# Patient Record
Sex: Female | Born: 1985 | Race: Asian | Hispanic: No | Marital: Married | Smoking: Never smoker
Health system: Southern US, Community
[De-identification: ages and names within clinical notes are randomized; demographics above are authoritative.]

## PROBLEM LIST (undated history)

## (undated) ENCOUNTER — Inpatient Hospital Stay (HOSPITAL_COMMUNITY): Payer: Self-pay

---

## 2013-11-16 ENCOUNTER — Encounter (HOSPITAL_COMMUNITY): Payer: Self-pay | Admitting: *Deleted

## 2013-11-16 ENCOUNTER — Inpatient Hospital Stay (HOSPITAL_COMMUNITY)
Admission: AD | Admit: 2013-11-16 | Discharge: 2013-11-16 | Disposition: A | Discharge status: Home / Self Care | Payer: Medicaid Other | Source: Ambulatory Visit | Attending: Family Medicine | Admitting: Family Medicine

## 2013-11-16 DIAGNOSIS — O4703 False labor before 37 completed weeks of gestation, third trimester: Secondary | ICD-10-CM

## 2013-11-16 DIAGNOSIS — O479 False labor, unspecified: Secondary | ICD-10-CM

## 2013-11-16 LAB — URINALYSIS, ROUTINE W REFLEX MICROSCOPIC
Bilirubin Urine: NEGATIVE
Hgb urine dipstick: NEGATIVE
Ketones, ur: NEGATIVE mg/dL
Nitrite: NEGATIVE
Specific Gravity, Urine: 1.02 (ref 1.005–1.030)
Urobilinogen, UA: 0.2 mg/dL (ref 0.0–1.0)
pH: 7 (ref 5.0–8.0)

## 2013-11-16 LAB — URINE MICROSCOPIC-ADD ON

## 2013-11-16 NOTE — Progress Notes (Signed)
Dr. Corrin Parker and Clayton Lefort, CNM spoke with patient at length regarding current status, exam and plan of care. [employer] utilized.

## 2013-11-16 NOTE — MAU Provider Note (Signed)
History     CSN: [id number]  Arrival date and time: 11/16/13 1319   None     No chief complaint on file.  HPI Pt is a 27 yo G1 @ [redacted]w[redacted]d by LMP who presents to MAU looking for prenatal care after moving from [place] 3 weeks ago. She states that she began prenatal care in her country at 1.5 months and there have been no concerns about her pregnancy (she brought records with her that are in [place]). She has no complaints now. When asked she says she has had some pressure in her lower abdomen and R side that comes and goes but it is not painful or getting more frequent. This started last night. She denies vaginal bleeding, leaking of fluid or decrease in fetal movement. She has whitish discharge that she has had throughout the pregnancy. She denies dysuria, vaginal burning/itching, fever, n/v/d. She does have some constipation and usually has BM every 2 days. She would like be set up for continuing prenatal care.  History reviewed. No pertinent past medical history.  History reviewed. No pertinent past surgical history.  History reviewed. No pertinent family history.  History  Substance Use Topics  . Smoking status: Never Smoker   . Smokeless tobacco: Never Used  . Alcohol Use: No    Allergies: No Known Allergies  Prescriptions prior to admission  Medication Sig Dispense Refill  . Prenatal Vit-Fe Fumarate-FA (MULTIVITAMIN-PRENATAL) 27-0.8 MG TABS tablet Take 1 tablet by mouth daily at 12 noon.        Review of Systems  Constitutional: Negative for fever, chills and malaise/fatigue.  HENT: Negative for congestion and sore throat.   Eyes: Negative for blurred vision, double vision and photophobia.  Respiratory: Negative for cough, shortness of breath and wheezing.   Cardiovascular: Negative for chest pain, palpitations and leg swelling.  Gastrointestinal: Positive for constipation. Negative for nausea, vomiting, abdominal pain, diarrhea, blood in stool and melena.   Genitourinary: Negative for dysuria, hematuria and flank pain.  Musculoskeletal: Negative for back pain, falls and joint pain.  Skin: Negative for itching and rash.  Neurological: Negative for dizziness, tingling, tremors, focal weakness, seizures, weakness and headaches.   Physical Exam   Blood pressure 115/78, pulse 72, temperature 98.1 F (36.7 C), temperature source Oral, resp. rate 16, SpO2 100.00%.  Physical Exam  Nursing note and vitals reviewed. Constitutional: She is oriented to person, place, and time. She appears well-developed and well-nourished. No distress.  HENT:  Head: Normocephalic and atraumatic.  Eyes: Conjunctivae are normal. Pupils are equal, round, and reactive to light. No scleral icterus.  Neck: Normal range of motion.  Cardiovascular: Normal rate, regular rhythm and intact distal pulses.   No murmur heard. Respiratory: Effort normal and breath sounds normal. She has no wheezes.  GI: Soft. She exhibits no distension. There is no tenderness. There is no guarding.  Genitourinary: No vaginal discharge found.  Musculoskeletal: Normal range of motion. She exhibits no edema and no tenderness.  Neurological: She is alert and oriented to person, place, and time. She displays normal reflexes.  Skin: Skin is warm and dry. No rash noted. She is not diaphoretic. No erythema. No pallor.  Psychiatric: She has a normal mood and affect. Her behavior is normal.  Dilation: 2 Effacement (%): 60 Cervical Position: Anterior Station: -2 Presentation: Vertex Exam by:: L.Kirby, CNM  Toco: mild, irregular contractions vs irritability FHR: baseline 145 bpm, moderate variability, accels present, no decels  Results for orders placed during the hospital encounter  of 11/16/13 (from the past 24 hour(s))  URINALYSIS, ROUTINE W REFLEX MICROSCOPIC     Status: Abnormal   Collection Time    11/16/13  2:00 PM      Result Value Range   Color, Urine YELLOW  YELLOW   APPearance CLOUDY (*)  CLEAR   Specific Gravity, Urine 1.020  1.005 - 1.030   pH 7.0  5.0 - 8.0   Glucose, UA NEGATIVE  NEGATIVE mg/dL   Hgb urine dipstick NEGATIVE  NEGATIVE   Bilirubin Urine NEGATIVE  NEGATIVE   Ketones, ur NEGATIVE  NEGATIVE mg/dL   Protein, ur NEGATIVE  NEGATIVE mg/dL   Urobilinogen, UA 0.2  0.0 - 1.0 mg/dL   Nitrite NEGATIVE  NEGATIVE   Leukocytes, UA SMALL (*) NEGATIVE  URINE MICROSCOPIC-ADD ON     Status: Abnormal   Collection Time    11/16/13  2:00 PM      Result Value Range   Squamous Epithelial / LPF MANY (*) RARE   WBC, UA 11-20  <3 WBC/hpf   Bacteria, UA FEW (*) RARE    MAU Course  Procedures  MDM Mild, irregular contractions on tocometer with cervical dilation on exam in G1 still preterm. She is not feeling painful contractions or having bleeding or leaking of fluid. Urine not concerning for infection though does have WBC as well as squamous epithelial cells. Urine culture obtained. Will orally rehydrate and continue to monitor. Will reassess for cervical change in 1 hour. On repeat cervical exam, no change was noted. Contractions remained irregular and non-painful.  Assessment and Plan  27 yo G1 @ [redacted]w[redacted]d with false labor and need for [facility] Sent message to [facility] to set up appointment to continue prenatal care Preterm labor precautions given  Pior, Jearld Lesch 11/16/2013, 4:55 PM   I have seen this patient and agree with the above resident's note.  Consulted with Dr Shawnie Pons at time of MAU visit.  Pt [redacted]w[redacted]d by 11 week sono according to prenatal records with no report of contractions and none on toco. Some of her prenatal records are in [place], with normal labs/ultrasounds reviewed.  Category I FHR tracing today.    LEFTWICH-KIRBY, LISA Certified Nurse-Midwife

## 2013-11-16 NOTE — MAU Note (Signed)
Patient presents to MAU having recently moved here from [facility] and states wants to make sure everything is okay with her pregnancy. States she did not understand what her doctors were telling her. Denies LOF, VB, nor contractions at this time. REports white discharge without odor. REports good fetal movement.

## 2013-11-18 LAB — URINE CULTURE

## 2013-11-19 ENCOUNTER — Telehealth: Payer: Self-pay | Admitting: Medical

## 2013-11-19 ENCOUNTER — Telehealth: Payer: Self-pay | Admitting: *Deleted

## 2013-11-19 DIAGNOSIS — B952 Enterococcus as the cause of diseases classified elsewhere: Secondary | ICD-10-CM

## 2013-11-19 MED ORDER — CEPHALEXIN 500 MG PO CAPS: 500.0000 mg | ORAL_CAPSULE | Freq: Four times a day (QID) | ORAL | Status: DC

## 2013-11-19 NOTE — Telephone Encounter (Signed)
No answer and no VM at patient's primary number. LM with Service for [employer] listed as emergency contact to return call to MAU because we were trying to get in touch with a patient. Rx for Keflex sent to patient's pharmacy for UTI  Freddi Starr, PA-C 11/19/2013 9:57 AM

## 2013-11-19 NOTE — Telephone Encounter (Signed)
Message copied by Gerome Apley on Mon Nov 19, 2013  2:24 PM ------      Message from: Reva Bores      Created: Mon Nov 19, 2013  8:10 AM       Has UTI--needs keflex 500 mg po qid # 28, no RF ------

## 2013-11-19 NOTE — Telephone Encounter (Signed)
Called Ray and unable to leave a message - heard message voicemail not set up.

## 2013-11-20 NOTE — MAU Provider Note (Signed)
Chart reviewed and agree with management and plan.  

## 2013-11-21 ENCOUNTER — Encounter: Payer: Self-pay | Admitting: Family Medicine

## 2013-11-21 NOTE — Telephone Encounter (Signed)
Patient has new ob appt tomorrow. Note made to inform her at visit. Tried calling patient but heard message that voicemail is not set up.

## 2013-12-13 NOTE — L&D Delivery Note (Signed)
Attestation of Attending Supervision of Advanced Practitioner (PA/CNM/NP): Evaluation and management procedures were performed by the Advanced Practitioner under my supervision and collaboration.  I have reviewed the Advanced Practitioner's note and chart, and I agree with the management and plan.  Diquan Kassis, MD, FACOG Attending Obstetrician & Gynecologist Faculty Practice, [facility]  

## 2013-12-13 NOTE — L&D Delivery Note (Signed)
Delivery Note At 5:41 PM a viable female was delivered via Vaginal, Spontaneous Delivery (Presentation: Left Occiput Anterior).  APGAR: 8, 9; weight 5 lb 13 oz (2637 g).   Placenta status: Intact, Spontaneous.  Cord: 3 vessels with the following complications: None.   Anesthesia: Local  Episiotomy: None Lacerations: Sulcal, Left labial and partial 3rd degree Suture Repair: 3.0 monocryl on CT Est. Blood Loss (mL): 350  Mom to postpartum.  Baby to Couplet care / Skin to Skin.  NSVD with complicated tear. Active management of 3rd stage of labor with pit and traction delivered intact placenta w/ 3v cord. Repaired tear with reinforced rectal sheath w/ 3.0 monocyl on CT. Repaired sulcal tear and labial tear in usual fashion. Hemostatic at completion. EBL 350. Counts correct.  Tina Byrd, Tina Byrd 12/28/2013, 6:21 PM

## 2013-12-28 ENCOUNTER — Encounter (HOSPITAL_COMMUNITY): Payer: Self-pay | Admitting: *Deleted

## 2013-12-28 ENCOUNTER — Inpatient Hospital Stay (HOSPITAL_COMMUNITY)
Admission: AD | Admit: 2013-12-28 | Discharge: 2013-12-30 | DRG: 775 | Disposition: A | Discharge status: Home / Self Care | Payer: Medicaid Other | Source: Ambulatory Visit | Attending: Obstetrics & Gynecology | Admitting: Obstetrics & Gynecology

## 2013-12-28 DIAGNOSIS — IMO0001 Reserved for inherently not codable concepts without codable children: Secondary | ICD-10-CM

## 2013-12-28 DIAGNOSIS — O0930 Supervision of pregnancy with insufficient antenatal care, unspecified trimester: Secondary | ICD-10-CM

## 2013-12-28 LAB — URINALYSIS, ROUTINE W REFLEX MICROSCOPIC
BILIRUBIN URINE: NEGATIVE
Glucose, UA: NEGATIVE mg/dL
Ketones, ur: NEGATIVE mg/dL
Nitrite: NEGATIVE
PH: 6.5 (ref 5.0–8.0)
Protein, ur: NEGATIVE mg/dL
SPECIFIC GRAVITY, URINE: 1.015 (ref 1.005–1.030)
UROBILINOGEN UA: 0.2 mg/dL (ref 0.0–1.0)

## 2013-12-28 LAB — COMPREHENSIVE METABOLIC PANEL
ALT: 10 U/L (ref 0–35)
AST: 16 U/L (ref 0–37)
Albumin: 2.9 g/dL — ABNORMAL LOW (ref 3.5–5.2)
Alkaline Phosphatase: 208 U/L — ABNORMAL HIGH (ref 39–117)
BUN: 7 mg/dL (ref 6–23)
CALCIUM: 9.4 mg/dL (ref 8.4–10.5)
CO2: 24 meq/L (ref 19–32)
CREATININE: 0.56 mg/dL (ref 0.50–1.10)
Chloride: 100 mEq/L (ref 96–112)
GFR calc non Af Amer: >90 mL/min (ref >90)
GLUCOSE: 73 mg/dL (ref 70–99)
Potassium: 4.6 mEq/L (ref 3.7–5.3)
SODIUM: 136 meq/L — AB (ref 137–147)
Total Bilirubin: <0.2 mg/dL — ABNORMAL LOW (ref 0.3–1.2)
Total Protein: 6.8 g/dL (ref 6.0–8.3)

## 2013-12-28 LAB — OB RESULTS CONSOLE GBS: STREP GROUP B AG: NEGATIVE

## 2013-12-28 LAB — URINE MICROSCOPIC-ADD ON

## 2013-12-28 LAB — CBC
HCT: 39.1 % (ref 36.0–46.0)
Hemoglobin: 13.7 g/dL (ref 12.0–15.0)
MCH: 30.5 pg (ref 26.0–34.0)
MCHC: 35 g/dL (ref 30.0–36.0)
MCV: 87.1 fL (ref 78.0–100.0)
Platelets: 302 10*3/uL (ref 150–400)
RBC: 4.49 MIL/uL (ref 3.87–5.11)
RDW: 14.6 % (ref 11.5–15.5)
WBC: 13.9 10*3/uL — ABNORMAL HIGH (ref 4.0–10.5)

## 2013-12-28 LAB — DIFFERENTIAL
BASOS ABS: 0 10*3/uL (ref 0.0–0.1)
BASOS PCT: 0 % (ref 0–1)
Eosinophils Absolute: 0.1 10*3/uL (ref 0.0–0.7)
Eosinophils Relative: 0 % (ref 0–5)
Lymphocytes Relative: 12 % (ref 12–46)
Lymphs Abs: 1.7 10*3/uL (ref 0.7–4.0)
Monocytes Absolute: 1.1 10*3/uL — ABNORMAL HIGH (ref 0.1–1.0)
Monocytes Relative: 8 % (ref 3–12)
NEUTROS ABS: 11.1 10*3/uL — AB (ref 1.7–7.7)
NEUTROS PCT: 80 % — AB (ref 43–77)

## 2013-12-28 LAB — SICKLE CELL SCREEN: Sickle Cell Screen: NEGATIVE

## 2013-12-28 LAB — RPR: RPR: NONREACTIVE

## 2013-12-28 LAB — ABO/RH: ABO/RH(D): O POS

## 2013-12-28 LAB — TYPE AND SCREEN
ABO/RH(D): O POS
ANTIBODY SCREEN: NEGATIVE

## 2013-12-28 LAB — GROUP B STREP BY PCR: Group B strep by PCR: NEGATIVE

## 2013-12-28 LAB — OB RESULTS CONSOLE HIV ANTIBODY (ROUTINE TESTING): HIV: NONREACTIVE

## 2013-12-28 LAB — RAPID HIV SCREEN (WH-MAU): Rapid HIV Screen: NONREACTIVE

## 2013-12-28 LAB — HEPATITIS B SURFACE ANTIGEN: Hepatitis B Surface Ag: NEGATIVE

## 2013-12-28 MED ORDER — DIBUCAINE 1 % RE OINT: 1.0000 "application " | TOPICAL_OINTMENT | RECTAL | Status: DC | PRN

## 2013-12-28 MED ORDER — OXYTOCIN 40 UNITS IN LACTATED RINGERS INFUSION - SIMPLE MED
62.5000 mL/h | INTRAVENOUS | Status: DC
  Administered 2013-12-28: 62.5 mL/h via INTRAVENOUS

## 2013-12-28 MED ORDER — CITRIC ACID-SODIUM CITRATE 334-500 MG/5ML PO SOLN: 30.0000 mL | ORAL | Status: DC | PRN

## 2013-12-28 MED ORDER — WITCH HAZEL-GLYCERIN EX PADS
1.0000 "application " | MEDICATED_PAD | CUTANEOUS | Status: DC | PRN
  Administered 2013-12-29: 1 via TOPICAL

## 2013-12-28 MED ORDER — TETANUS-DIPHTH-ACELL PERTUSSIS 5-2.5-18.5 LF-MCG/0.5 IM SUSP: 0.5000 mL | Freq: Once | INTRAMUSCULAR | Status: DC

## 2013-12-28 MED ORDER — DIPHENHYDRAMINE HCL 25 MG PO CAPS: 25.0000 mg | ORAL_CAPSULE | Freq: Four times a day (QID) | ORAL | Status: DC | PRN

## 2013-12-28 MED ORDER — SIMETHICONE 80 MG PO CHEW: 80.0000 mg | CHEWABLE_TABLET | ORAL | Status: DC | PRN

## 2013-12-28 MED ORDER — ZOLPIDEM TARTRATE 5 MG PO TABS
5.0000 mg | ORAL_TABLET | Freq: Every evening | ORAL | Status: DC | PRN
Start: 2013-12-28 — End: 2013-12-30

## 2013-12-28 MED ORDER — OXYCODONE-ACETAMINOPHEN 5-325 MG PO TABS
1.0000 | ORAL_TABLET | ORAL | Status: DC | PRN
  Administered 2013-12-29: 1 via ORAL
  Filled 2013-12-28: qty 1

## 2013-12-28 MED ORDER — LANOLIN HYDROUS EX OINT: TOPICAL_OINTMENT | CUTANEOUS | Status: DC | PRN

## 2013-12-28 MED ORDER — ONDANSETRON HCL 4 MG/2ML IJ SOLN: 4.0000 mg | Freq: Four times a day (QID) | INTRAMUSCULAR | Status: DC | PRN

## 2013-12-28 MED ORDER — FLEET ENEMA 7-19 GM/118ML RE ENEM: 1.0000 | ENEMA | RECTAL | Status: DC | PRN

## 2013-12-28 MED ORDER — ACETAMINOPHEN 325 MG PO TABS: 650.0000 mg | ORAL_TABLET | ORAL | Status: DC | PRN

## 2013-12-28 MED ORDER — ONDANSETRON HCL 4 MG/2ML IJ SOLN: 4.0000 mg | INTRAMUSCULAR | Status: DC | PRN

## 2013-12-28 MED ORDER — IBUPROFEN 600 MG PO TABS
600.0000 mg | ORAL_TABLET | Freq: Four times a day (QID) | ORAL | Status: DC | PRN
  Administered 2013-12-28: 600 mg via ORAL
  Filled 2013-12-28: qty 1

## 2013-12-28 MED ORDER — PRENATAL MULTIVITAMIN CH
1.0000 | ORAL_TABLET | Freq: Every day | ORAL | Status: DC
  Administered 2013-12-29 – 2013-12-30 (×2): 1 via ORAL
  Filled 2013-12-28 (×2): qty 1

## 2013-12-28 MED ORDER — IBUPROFEN 600 MG PO TABS
600.0000 mg | ORAL_TABLET | Freq: Four times a day (QID) | ORAL | Status: DC
  Administered 2013-12-28 – 2013-12-30 (×7): 600 mg via ORAL
  Filled 2013-12-28 (×7): qty 1

## 2013-12-28 MED ORDER — OXYTOCIN 40 UNITS IN LACTATED RINGERS INFUSION - SIMPLE MED
1.0000 m[IU]/min | INTRAVENOUS | Status: DC
  Administered 2013-12-28: 2 m[IU]/min via INTRAVENOUS
  Filled 2013-12-28: qty 1000

## 2013-12-28 MED ORDER — LIDOCAINE HCL (PF) 1 % IJ SOLN
30.0000 mL | INTRAMUSCULAR | Status: DC | PRN
  Administered 2013-12-28: 30 mL via SUBCUTANEOUS
  Filled 2013-12-28 (×2): qty 30

## 2013-12-28 MED ORDER — OXYCODONE-ACETAMINOPHEN 5-325 MG PO TABS
1.0000 | ORAL_TABLET | ORAL | Status: DC | PRN
  Administered 2013-12-28: 1 via ORAL
  Filled 2013-12-28: qty 1

## 2013-12-28 MED ORDER — FENTANYL CITRATE 0.05 MG/ML IJ SOLN
100.0000 ug | INTRAMUSCULAR | Status: DC | PRN
  Administered 2013-12-28: 100 ug via INTRAVENOUS
  Filled 2013-12-28: qty 2

## 2013-12-28 MED ORDER — OXYTOCIN BOLUS FROM INFUSION
500.0000 mL | INTRAVENOUS | Status: DC
Start: 2013-12-28 — End: 2013-12-28

## 2013-12-28 MED ORDER — LACTATED RINGERS IV SOLN: 500.0000 mL | INTRAVENOUS | Status: DC | PRN

## 2013-12-28 MED ORDER — LACTATED RINGERS IV SOLN
INTRAVENOUS | Status: DC
  Administered 2013-12-28: 125 mL/h via INTRAVENOUS

## 2013-12-28 MED ORDER — ONDANSETRON HCL 4 MG PO TABS: 4.0000 mg | ORAL_TABLET | ORAL | Status: DC | PRN

## 2013-12-28 MED ORDER — TERBUTALINE SULFATE 1 MG/ML IJ SOLN: 0.2500 mg | Freq: Once | INTRAMUSCULAR | Status: DC | PRN

## 2013-12-28 MED ORDER — BENZOCAINE-MENTHOL 20-0.5 % EX AERO
1.0000 "application " | INHALATION_SPRAY | CUTANEOUS | Status: DC | PRN
  Administered 2013-12-29: 1 via TOPICAL
  Filled 2013-12-28: qty 56

## 2013-12-28 MED ORDER — SENNOSIDES-DOCUSATE SODIUM 8.6-50 MG PO TABS
2.0000 | ORAL_TABLET | ORAL | Status: DC
  Administered 2013-12-28 – 2013-12-30 (×2): 2 via ORAL
  Filled 2013-12-28 (×2): qty 2

## 2013-12-28 NOTE — MAU Note (Signed)
C/o ucs since 0400 this AM; 

## 2013-12-28 NOTE — H&P (Signed)
Attestation of Attending Supervision of Obstetric Fellow: Evaluation and management procedures were performed by the Obstetric Fellow under my supervision and collaboration.  I have reviewed the Obstetric Fellow's note and chart, and I agree with the management and plan.  Yalanda Soderman, MD, FACOG Attending Obstetrician & Gynecologist Faculty Practice, [facility]   

## 2013-12-28 NOTE — H&P (Signed)
CSN: [id number]  Arrival date and time: 12/28/13 [id number]  None  Chief Complaint   Patient presents with   .  Labor Eval    HPI  [name] is a 28 y.o. female G1P0 at 6427w6d who presents for labor evaluation. Pt says her "pains started at 0400". Pt has no prenatal care; was seen here in MAU about a month ago and referred to the low risk clinic. Pt did not schedule a clinic appointment because " I had no medicaid". Pt has had no US done with this pregnancy.  OB History    Grav  Para  Term  Preterm  Abortions  TAB  SAB  Ect  Mult  Living    1               History reviewed. No pertinent past medical history.  History reviewed. No pertinent past surgical history.  Family History   Problem  Relation  Age of Onset   .  Diabetes  Mother     History   Substance Use Topics   .  Smoking status:  Never Smoker   .  Smokeless tobacco:  Never Used   .  Alcohol Use:  No    Allergies: No Known Allergies  Prescriptions prior to admission   Medication  Sig  Dispense  Refill   .  Prenatal Vit-Fe Fumarate-FA (PRENATAL MULTIVITAMIN) TABS tablet  Take 1 tablet by mouth daily at 12 noon.      Results for orders placed during the hospital encounter of 12/28/13 (from the past 48 hour(s))   URINALYSIS, ROUTINE W REFLEX MICROSCOPIC Status: Abnormal    Collection Time    12/28/13 9:35 AM   Result  Value  Range    Color, Urine  YELLOW  YELLOW    APPearance  CLOUDY (*)  CLEAR    Specific Gravity, Urine  1.015  1.005 - 1.030    pH  6.5  5.0 - 8.0    Glucose, UA  NEGATIVE  NEGATIVE mg/dL    Hgb urine dipstick  TRACE (*)  NEGATIVE    Bilirubin Urine  NEGATIVE  NEGATIVE    Ketones, ur  NEGATIVE  NEGATIVE mg/dL    Protein, ur  NEGATIVE  NEGATIVE mg/dL    Urobilinogen, UA  0.2  0.0 - 1.0 mg/dL    Nitrite  NEGATIVE  NEGATIVE    Leukocytes, UA  TRACE (*)  NEGATIVE   URINE MICROSCOPIC-ADD ON Status: Abnormal    Collection Time    12/28/13 9:35 AM   Result  Value  Range    Squamous Epithelial / LPF  MANY  (*)  RARE    WBC, UA  0-2  <3 WBC/hpf    Bacteria, UA  RARE  RARE   CBC Status: Abnormal    Collection Time    12/28/13 10:52 AM   Result  Value  Range    WBC  13.9 (*)  4.0 - 10.5 K/uL    RBC  4.49  3.87 - 5.11 MIL/uL    Hemoglobin  13.7  12.0 - 15.0 g/dL    HCT  11.939.1  14.736.0 - 82.946.0 %    MCV  87.1  78.0 - 100.0 fL    MCH  30.5  26.0 - 34.0 pg    MCHC  35.0  30.0 - 36.0 g/dL    RDW  56.214.6  13.011.5 - 86.515.5 %    Platelets  302  150 - 400 K/uL  DIFFERENTIAL Status: Abnormal    Collection Time    12/28/13 10:52 AM   Result  Value  Range    Neutrophils Relative %  80 (*)  43 - 77 %    Neutro Abs  11.1 (*)  1.7 - 7.7 K/uL    Lymphocytes Relative  12  12 - 46 %    Lymphs Abs  1.7  0.7 - 4.0 K/uL    Monocytes Relative  8  3 - 12 %    Monocytes Absolute  1.1 (*)  0.1 - 1.0 K/uL    Eosinophils Relative  0  0 - 5 %    Eosinophils Absolute  0.1  0.0 - 0.7 K/uL    Basophils Relative  0  0 - 1 %    Basophils Absolute  0.0  0.0 - 0.1 K/uL    Dilation: 3.5  Effacement (%): 90  Cervical Position: Middle  Station: -2  Presentation: Vertex  Exam by:: Morrison Old RN  Review of Systems  Constitutional: Negative for fever and chills.  Gastrointestinal: Positive for abdominal pain. Negative for nausea, vomiting, diarrhea and constipation.  Genitourinary: Negative for dysuria, urgency, frequency and hematuria.   Prenatal Transfer Tool  Maternal Diabetes: Unknown. No PNC. Genetic Screening: None. No PNC. Maternal Ultrasounds/Referrals: None. No PNC Fetal Ultrasounds or other Referrals:  None. No PNC. Maternal Substance Abuse:  Unknown. No medical record Significant Maternal Medications:  None. No PNC. Significant Maternal Lab Results:  None. No PNC. Other Comments:  N/a   Physical Exam   Blood pressure 125/81, pulse 79, temperature 98.3 F (36.8 C), temperature source Oral, resp. rate 18.  Physical Exam  Constitutional: She is oriented to person, place, and time. She appears well-developed  and well-nourished. No distress.  HENT:  Head: Normocephalic.  Eyes: Pupils are equal, round, and reactive to light.  Neck: Neck supple.  GI: Soft.  Musculoskeletal: Normal range of motion.  Neurological: She is alert and oriented to person, place, and time.  Skin: Skin is warm. She is not diaphoretic.  Psychiatric: Her behavior is normal.   Fetal Tracing:  Baseline: 135 bpm  Variability: Moderate  Accelerations: 15x15  Decelerations: None  Toco: Contraction 4-6 mins apart  MAU Course   Procedures  None  MDM  NST  OB panel  GBS stat  Dr. Ike Bene notified of patient status; will admit to labor and delivery.  Assessment and Plan   A:  Labor; term gestation  No prenatal care   Tina Byrd is a 28 y.o. G1P0 at [redacted]w[redacted]d  here for SOOL #Labor: Pt with no recent chagne, will augment with pit #Pain: Desires IV pain meds and epidural #FWB: Cat i #ID:  Gbs Neg #MOF: Breast #MOC:Declines currently  Admit to Labor and delivery  Iona Hansen Rasch, NP  12/28/2013, 11:30 AM   Tawana Scale, MD OB Fellow

## 2013-12-28 NOTE — MAU Provider Note (Signed)
Attestation of Attending Supervision of Advanced Practitioner (PA/CNM/NP): Evaluation and management procedures were performed by the Advanced Practitioner under my supervision and collaboration.  I have reviewed the Advanced Practitioner's note and chart, and I agree with the management and plan.  Kyilee Gregg, MD, FACOG Attending Obstetrician & Gynecologist Faculty Practice, [facility]  

## 2013-12-28 NOTE — Progress Notes (Signed)
Family from [place].  Pt and family wanted infant wrapped and held by father.  Unable to explain importance of skin to skin due to language barrier

## 2013-12-28 NOTE — H&P (Signed)
Attestation of Attending Supervision of Obstetric Fellow: Evaluation and management procedures were performed by the Obstetric Fellow under my supervision and collaboration.  I have reviewed the Obstetric Fellow's note and chart, and I agree with the management and plan.  Lekesha Claw, MD, FACOG Attending Obstetrician & Gynecologist Faculty Practice, [facility]   

## 2013-12-28 NOTE — H&P (Signed)
DUPLICATE dUPLICATE   Maternal Medical History:  Reason for admission: Contractions.  Nausea.  Contractions: Onset was 6-12 hours ago.   Frequency: regular.   Duration is approximately 3 minutes.   Perceived severity is moderate.    Fetal activity: Perceived fetal activity is normal.    Prenatal complications: No PNC     Review of Systems  Constitutional: Negative for fever and chills.  Gastrointestinal: Negative for nausea, vomiting and diarrhea.  Genitourinary: Negative for dysuria and urgency.    Maternal Exam:  Uterine Assessment: Contraction strength is moderate.  Contraction duration is 3 minutes. Contraction frequency is regular.   Abdomen: Patient reports no abdominal tenderness. Estimated fetal weight is 6 lb 8 oz.   Fetal presentation: vertex  Introitus: Normal vulva. Normal vagina.  Amniotic fluid character: not assessed.  Cervix: Cervix evaluated by digital exam.     Physical Exam  Constitutional: She appears well-developed and well-nourished.  HENT:  Head: Normocephalic.  Cardiovascular: Normal rate, regular rhythm and normal heart sounds.   GI: There is no guarding.  Musculoskeletal: She exhibits no edema.  Neurological: She is alert.

## 2013-12-28 NOTE — MAU Provider Note (Signed)
History     CSN: [id number]  Arrival date and time: 12/28/13 4098   None     Chief Complaint  Patient presents with  . Labor Eval   HPI  [name] is a 28 y.o. female G1P0 at [redacted]w[redacted]d who presents for labor evaluation. Pt says her "pains started at 0400". Pt has no prenatal care; was seen here in MAU about a month ago and referred to the low risk clinic. Pt did not schedule a clinic appointment because " I had no medicaid". Pt has had no US done with this pregnancy.   OB History   Grav Para Term Preterm Abortions TAB SAB Ect Mult Living   1               History reviewed. No pertinent past medical history.  History reviewed. No pertinent past surgical history.  Family History  Problem Relation Age of Onset  . Diabetes Mother     History  Substance Use Topics  . Smoking status: Never Smoker   . Smokeless tobacco: Never Used  . Alcohol Use: No    Allergies: No Known Allergies  Prescriptions prior to admission  Medication Sig Dispense Refill  . Prenatal Vit-Fe Fumarate-FA (PRENATAL MULTIVITAMIN) TABS tablet Take 1 tablet by mouth daily at 12 noon.       Results for orders placed during the hospital encounter of 12/28/13 (from the past 48 hour(s))  URINALYSIS, ROUTINE W REFLEX MICROSCOPIC     Status: Abnormal   Collection Time    12/28/13  9:35 AM      Result Value Range   Color, Urine YELLOW  YELLOW   APPearance CLOUDY (*) CLEAR   Specific Gravity, Urine 1.015  1.005 - 1.030   pH 6.5  5.0 - 8.0   Glucose, UA NEGATIVE  NEGATIVE mg/dL   Hgb urine dipstick TRACE (*) NEGATIVE   Bilirubin Urine NEGATIVE  NEGATIVE   Ketones, ur NEGATIVE  NEGATIVE mg/dL   Protein, ur NEGATIVE  NEGATIVE mg/dL   Urobilinogen, UA 0.2  0.0 - 1.0 mg/dL   Nitrite NEGATIVE  NEGATIVE   Leukocytes, UA TRACE (*) NEGATIVE  URINE MICROSCOPIC-ADD ON     Status: Abnormal   Collection Time    12/28/13  9:35 AM      Result Value Range   Squamous Epithelial / LPF MANY (*) RARE   WBC, UA  0-2  <3 WBC/hpf   Bacteria, UA RARE  RARE  CBC     Status: Abnormal   Collection Time    12/28/13 10:52 AM      Result Value Range   WBC 13.9 (*) 4.0 - 10.5 K/uL   RBC 4.49  3.87 - 5.11 MIL/uL   Hemoglobin 13.7  12.0 - 15.0 g/dL   HCT 11.9  14.7 - 82.9 %   MCV 87.1  78.0 - 100.0 fL   MCH 30.5  26.0 - 34.0 pg   MCHC 35.0  30.0 - 36.0 g/dL   RDW 56.2  13.0 - 86.5 %   Platelets 302  150 - 400 K/uL  DIFFERENTIAL     Status: Abnormal   Collection Time    12/28/13 10:52 AM      Result Value Range   Neutrophils Relative % 80 (*) 43 - 77 %   Neutro Abs 11.1 (*) 1.7 - 7.7 K/uL   Lymphocytes Relative 12  12 - 46 %   Lymphs Abs 1.7  0.7 - 4.0 K/uL  Monocytes Relative 8  3 - 12 %   Monocytes Absolute 1.1 (*) 0.1 - 1.0 K/uL   Eosinophils Relative 0  0 - 5 %   Eosinophils Absolute 0.1  0.0 - 0.7 K/uL   Basophils Relative 0  0 - 1 %   Basophils Absolute 0.0  0.0 - 0.1 K/uL    Dilation: 3.5 Effacement (%): 90 Cervical Position: Middle Station: -2 Presentation: Vertex Exam by:: Morrison Oldee Carter RN  Review of Systems  Constitutional: Negative for fever and chills.  Gastrointestinal: Positive for abdominal pain. Negative for nausea, vomiting, diarrhea and constipation.  Genitourinary: Negative for dysuria, urgency, frequency and hematuria.   Physical Exam   Blood pressure 125/81, pulse 79, temperature 98.3 F (36.8 C), temperature source Oral, resp. rate 18.  Physical Exam  Constitutional: She is oriented to person, place, and time. She appears well-developed and well-nourished. No distress.  HENT:  Head: Normocephalic.  Eyes: Pupils are equal, round, and reactive to light.  Neck: Neck supple.  GI: Soft.  Musculoskeletal: Normal range of motion.  Neurological: She is alert and oriented to person, place, and time.  Skin: Skin is warm. She is not diaphoretic.  Psychiatric: Her behavior is normal.    Fetal Tracing: Baseline: 135 bpm  Variability: Moderate  Accelerations:  15x15 Decelerations: None Toco: Contraction 4-6 mins apart    MAU Course  Procedures None  MDM NST OB panel GBS stat Dr. Ike Benedom notified of patient status; will admit to labor and delivery.   Assessment and Plan   A: Labor; term gestation  No prenatal care   P: Admit to Labor and delivery   Tina HansenJennifer Irene Schawn Byas, NP  12/28/2013, 11:30 AM

## 2013-12-28 NOTE — Progress Notes (Signed)
Dr Ike Benedom talking with pt concerning pain medication options and [facility]

## 2013-12-28 NOTE — Progress Notes (Signed)
involentary urge to push

## 2013-12-29 LAB — GC/CHLAMYDIA PROBE AMP
CT Probe RNA: NEGATIVE
GC Probe RNA: NEGATIVE

## 2013-12-29 LAB — RUBELLA SCREEN: RUBELLA: 1.79 {index} — AB (ref <0.90)

## 2013-12-29 NOTE — Discharge Summary (Signed)
Obstetric Discharge Summary Reason for Admission: onset of labor Prenatal Procedures: none Intrapartum Procedures: spontaneous vaginal delivery Postpartum Procedures: none Complications-Operative and Postpartum: 3rd  degree perineal laceration and sulcal laceration Hemoglobin  Date Value Range Status  12/28/2013 13.7  12.0 - 15.0 g/dL Final     HCT  Date Value Range Status  12/28/2013 39.1  36.0 - 46.0 % Final    Physical Exam:  General: alert, cooperative and no distress Lochia: appropriate Uterine Fundus: firm Incision: healing well, no significant erythema DVT Evaluation: No evidence of DVT seen on physical exam.  Discharge Diagnoses: Term Pregnancy-delivered  Discharge Information: Date: 12/29/2013 Activity: unrestricted Diet: routine Medications: PNV, Ibuprofen, Colace and tucks Condition: stable Instructions: refer to practice specific booklet Discharge to: home Follow-up Information   [facility]. Call in 1 week. (If you do not receive call in one week to schedule appt., call them to schedule 6 week appt.)    Specialty:  Obstetrics and Gynecology   Contact information:   [address] [place] [id number] [phone]      Newborn Data: Live born female  Birth Weight: 5 lb 13 oz (2637 g) APGAR: 8, 9  Home with mother.  Tina Byrd, Tina 12/29/2013, 8:16 AM  I have seen and examined this patient and I agree with the above. Cam HaiSHAW, Miranda Garber 8:38 AM 12/29/2013

## 2013-12-29 NOTE — Progress Notes (Signed)
Clinical Social Work Department PSYCHOSOCIAL ASSESSMENT - MATERNAL/CHILD 12/29/2013  Patient:  Tina Byrd, Tina Byrd  Account Number:  [id number]  Admit Date:  12/28/2013  Ardine Eng Name:   Tina Byrd [facility]    Clinical Social Worker:  Shavon Ashmore, LCSW   Date/Time:  12/29/2013 11:00 AM  Date Referred:  12/29/2013   Referral source  Central Nursery     Referred reason  [facility]   Other referral source:    I:  FAMILY / HOME ENVIRONMENT Child's legal guardian:  PARENT  Guardian - Name Guardian - Age Guardian - Address  [address] [place], [place] [address]  Elvera Maria  same as above   Other household support members/support persons Other support:    II  PSYCHOSOCIAL DATA Information Source:  Patient Interview  Occupational hygienist Employment:   Museum/gallery curator resources:  Self Pay If [place]:    School / Grade:   Maternity Care Coordinator / Child Services Coordination / Early Interventions:  Cultural issues impacting care:    III  STRENGTHS Strengths  Home prepared for Child (including basic supplies)  Supportive family/friends   Strength comment:    IV  RISK FACTORS AND CURRENT PROBLEMS Current Problem:     No barriers to discharge noted at this time.  V  SOCIAL WORK ASSESSMENT Acknowledged order for social work consult due to [facility]. Met with both parents.  They are of [place] descent.  Mother states that they moved to the [place] about two months and are currently being supported through a program that provides assistance to refugees.  Informed that they have a case manager Nsoma Mick Sell [phone] who is assisting them with connecting with available resources. Also informed that they have an aunt, Hrin Guadelupe Sabin Par who has been living here for 8 years and has been supportive. They reportedly have needed baby care items.  Father is not currently employed.  Informed that he is waiting for his ID to arrive then he will be able to  seek employment.  She denies any hx of substance abuse or mental illness.  Mother states that she received [place] in [place].  Informed parents of the hospital's drug screen policy.  UDS on newborn was negative.  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      VI SOCIAL WORK PLAN  Type of pt/family education:   If child protective services report - county:   If child protective services report - date:   Information/referral to community resources comment:   Other social work plan:   Will continue to monitor drug screen

## 2013-12-30 MED ORDER — PRENATAL MULTIVITAMIN CH: 1.0000 | ORAL_TABLET | Freq: Every day | ORAL | Status: DC

## 2013-12-30 MED ORDER — IBUPROFEN 600 MG PO TABS: 600.0000 mg | ORAL_TABLET | Freq: Four times a day (QID) | ORAL | Status: DC | PRN

## 2013-12-30 NOTE — Discharge Summary (Signed)
  Obstetric Discharge Summary Reason for Admission: onset of labor Prenatal Procedures: NST Intrapartum Procedures: spontaneous vaginal delivery Postpartum Procedures: none Complications-Operative and Postpartum: Sulcal, Left labial and partial 3rd degree perineal tear  Hemoglobin  Date Value Range Status  12/28/2013 13.7  12.0 - 15.0 g/dL Final     HCT  Date Value Range Status  12/28/2013 39.1  36.0 - 46.0 % Final    Physical Exam:  General: alert, cooperative and no distress Lochia: appropriate Uterine Fundus: firm DVT Evaluation: No evidence of DVT seen on physical exam.  Discharge Diagnoses: Term Pregnancy-delivered  Discharge Information: Date: 12/30/2013 Activity: pelvic rest Diet: routine Medications: PNV, Ibuprofen and Colace Condition: stable Instructions: refer to practice specific booklet Discharge to: home Follow-up Information   [facility] Call in 1 week. (If you do not receive call in one week to schedule appt., call them to schedule 6 week appt.)    Specialty:  Obstetrics and Gynecology   Contact information:   [address] [place] [id number] [phone]      Newborn Data: Live born female  Birth Weight: 5 lb 13 oz (2637 g) APGAR: 8, 9  Home with mother.  Tina Byrd 12/30/2013, 8:07 AM

## 2013-12-30 NOTE — Discharge Summary (Addendum)
I spoke with and examined patient and agree with resident's note and plan of care.  Eating, drinking, voiding, ambulating well.  +flatus.  Lochia and pain wnl.  Denies dizziness, lightheadedness, or sob. No complaints.  No pnc, to f/u at clinic. Note sent to clinic to contact pt to make pp visit for 4-6 weeks. Pt to call clinic if she hasn't heard from them.  Had partial 3rd degree, recommended colace and increased po fluid intake prophylactically  Cheral MarkerKimberly R. Mckyla Deckman, CNM, [facility]NP-BC 12/30/2013 9:12 AM

## 2013-12-30 NOTE — Discharge Summary (Signed)
Attestation of Attending Supervision of Advanced Practitioner (PA/CNM/NP): Evaluation and management procedures were performed by the Advanced Practitioner under my supervision and collaboration.  I have reviewed the Advanced Practitioner's note and chart, and I agree with the management and plan.  Hurman Ketelsen, MD, FACOG Attending Obstetrician & Gynecologist Faculty Practice, [facility]  

## 2013-12-30 NOTE — Discharge Instructions (Signed)
You may want to take a stool softener such as Colace daily or twice daily to help prevent constipation. Drink plenty of water to stay well hydrated.   Before [facility] Ask any questions about feeding, diapering, and baby care before you leave the hospital. Ask again if you do not understand. Ask when you need to see the doctor again. There are several things you must have before your baby comes home.  Infant car seat.  Crib.  Do not let your baby sleep in a bed with you or anyone else.  If you do not have a bed for your baby, ask the doctor what you can use that will be safe for the baby to sleep in. Infant feeding supplies:  6 to 8 bottles (8 oz. size).  6 to 8 nipples.  Measuring cup.  Measuring tablespoon.  Bottle brush.  Sterilizer (or use any large pan or kettle with a lid).  Formula that contains iron.  A way to boil and cool water. Breastfeeding supplies:  Breast pump.  Nipple cream. Clothing:  24 to 36 cloth diapers and waterproof diaper covers or a box of disposable diapers. You may need as many as 10 to 12 diapers per day.  3 onesies (other clothing will depend on the time of year and the weather).  3 receiving blankets.  3 baby pajamas or gowns.  3 bibs. Bath equipment:  Mild soap.  Petroleum jelly. No baby oil or powder.  Soft cloth towel and wash cloth.  Cotton balls.  Separate bath basin for baby. Only sponge bathe until umbilical cord and circumcision are healed. Other supplies:  Thermometer and bulb syringe (ask the hospital to send them home with you). Ask your doctor about how you should take your baby's temperature.  One to two pacifiers. Prepare for an emergency:  Know how to get to the hospital and know where to admit your baby.  Put all doctor numbers near your house phone and in your cell phone if you have one. Prepare your family:  Talk with siblings about the baby coming home and how they feel about it.  Decide how  you want to handle visitors and other family members.  Take offers for help with the baby. You will need time to adjust. Know when to call the doctor.  GET HELP RIGHT AWAY IF:  Your baby's temperature is greater than 100.4 F (38 C).  The softspot on your baby's head starts to bulge.  Your baby is crying with no tears or has no wet diapers for 6 hours.  Your baby has rapid breathing.  Your baby is not as alert. Document Released: 11/11/2008 Document Revised: 02/21/2012 Document Reviewed: 02/18/2011 [facility] Patient Information 2014 [place], [place].  Vaginal Delivery Care After Refer to this sheet in the next few weeks. These discharge instructions provide you with information on caring for yourself after delivery. Your caregiver may also give you specific instructions. Your treatment has been planned according to the most current medical practices available, but problems sometimes occur. Call your caregiver if you have any problems or questions after you go home. HOME CARE INSTRUCTIONS  Take over-the-counter or prescription medicines only as directed by your caregiver or pharmacist.  Do not drink alcohol, especially if you are breastfeeding or taking medicine to relieve pain.  Do not chew or smoke tobacco.  Do not use illegal drugs.  Continue to use good perineal care. Good perineal care includes:  Wiping your perineum from front to back.  Keeping your perineum clean.  Do not use tampons or douche until your caregiver says it is okay.  Shower, wash your hair, and take tub baths as directed by your caregiver.  Wear a well-fitting bra that provides breast support.  Eat healthy foods.  Drink enough fluids to keep your urine clear or pale yellow.  Eat high-fiber foods such as whole grain cereals and breads, brown rice, beans, and fresh fruits and vegetables every day. These foods may help prevent or relieve constipation.  Follow your cargiver's recommendations regarding  resumption of activities such as climbing stairs, driving, lifting, exercising, or traveling.  Talk to your caregiver about resuming sexual activities. Resumption of sexual activities is dependent upon your risk of infection, your rate of healing, and your comfort and desire to resume sexual activity.  Try to have someone help you with your household activities and your newborn for at least a few days after you leave the hospital.  Rest as much as possible. Try to rest or take a nap when your newborn is sleeping.  Increase your activities gradually.  Keep all of your scheduled postpartum appointments. It is very important to keep your scheduled follow-up appointments. At these appointments, your caregiver will be checking to make sure that you are healing physically and emotionally. SEEK MEDICAL CARE IF:   You are passing large clots from your vagina. Save any clots to show your caregiver.  You have a foul smelling discharge from your vagina.  You have trouble urinating.  You are urinating frequently.  You have pain when you urinate.  You have a change in your bowel movements.  You have increasing redness, pain, or swelling near your vaginal incision (episiotomy) or vaginal tear.  You have pus draining from your episiotomy or vaginal tear.  Your episiotomy or vaginal tear is separating.  You have painful, hard, or reddened breasts.  You have a severe headache.  You have blurred vision or see spots.  You feel sad or depressed.  You have thoughts of hurting yourself or your newborn.  You have questions about your care, the care of your newborn, or medicines.  You are dizzy or lightheaded.  You have a rash.  You have nausea or vomiting.  You were breastfeeding and have not had a menstrual period within 12 weeks after you stopped breastfeeding.  You are not breastfeeding and have not had a menstrual period by the 12th week after delivery.  You have a fever. SEEK  IMMEDIATE MEDICAL CARE IF:   You have persistent pain.  You have chest pain.  You have shortness of breath.  You faint.  You have leg pain.  You have stomach pain.  Your vaginal bleeding saturates two or more sanitary pads in 1 hour. MAKE SURE YOU:   Understand these instructions.  Will watch your condition.  Will get help right away if you are not doing well or get worse. Document Released: 11/26/2000 Document Revised: 08/23/2012 Document Reviewed: 07/26/2012 [facility] Patient Information 2014 [place], [place].  Breastfeeding Deciding to breastfeed is one of the best choices you can make for you and your baby. A change in hormones during pregnancy causes your breast tissue to grow and increases the number and size of your milk ducts. These hormones also allow proteins, sugars, and fats from your blood supply to make breast milk in your milk-producing glands. Hormones prevent breast milk from being released before your baby is born as well as prompt milk flow after birth. Once breastfeeding has  begun, thoughts of your baby, as well as his or her sucking or crying, can stimulate the release of milk from your milk-producing glands.  BENEFITS OF BREASTFEEDING For Your Baby  Your first milk (colostrum) helps your baby's digestive system function better.   There are antibodies in your milk that help your baby fight off infections.   Your baby has a lower incidence of asthma, allergies, and sudden infant death syndrome.   The nutrients in breast milk are better for your baby than infant formulas and are designed uniquely for your baby's needs.   Breast milk improves your baby's brain development.   Your baby is less likely to develop other conditions, such as childhood obesity, asthma, or type 2 diabetes mellitus.  For You   Breastfeeding helps to create a very special bond between you and your baby.   Breastfeeding is convenient. Breast milk is always available at the  correct temperature and costs nothing.   Breastfeeding helps to burn calories and helps you lose the weight gained during pregnancy.   Breastfeeding makes your uterus contract to its prepregnancy size faster and slows bleeding (lochia) after you give birth.   Breastfeeding helps to lower your risk of developing type 2 diabetes mellitus, osteoporosis, and breast or ovarian cancer later in life. SIGNS THAT YOUR BABY IS HUNGRY Early Signs of Hunger  Increased alertness or activity.  Stretching.  Movement of the head from side to side.  Movement of the head and opening of the mouth when the corner of the mouth or cheek is stroked (rooting).  Increased sucking sounds, smacking lips, cooing, sighing, or squeaking.  Hand-to-mouth movements.  Increased sucking of fingers or hands. Late Signs of Hunger  Fussing.  Intermittent crying. Extreme Signs of Hunger Signs of extreme hunger will require calming and consoling before your baby will be able to breastfeed successfully. Do not wait for the following signs of extreme hunger to occur before you initiate breastfeeding:   Restlessness.  A loud, strong cry.   Screaming. BREASTFEEDING BASICS Breastfeeding Initiation  Find a comfortable place to sit or lie down, with your neck and back well supported.  Place a pillow or rolled up blanket under your baby to bring him or her to the level of your breast (if you are seated). Nursing pillows are specially designed to help support your arms and your baby while you breastfeed.  Make sure that your baby's abdomen is facing your abdomen.   Gently massage your breast. With your fingertips, massage from your chest wall toward your nipple in a circular motion. This encourages milk flow. You may need to continue this action during the feeding if your milk flows slowly.  Support your breast with 4 fingers underneath and your thumb above your nipple. Make sure your fingers are well away from  your nipple and your baby's mouth.   Stroke your baby's lips gently with your finger or nipple.   When your baby's mouth is open wide enough, quickly bring your baby to your breast, placing your entire nipple and as much of the colored area around your nipple (areola) as possible into your baby's mouth.   More areola should be visible above your baby's upper lip than below the lower lip.   Your baby's tongue should be between his or her lower gum and your breast.   Ensure that your baby's mouth is correctly positioned around your nipple (latched). Your baby's lips should create a seal on your breast and be turned  out (everted).  It is common for your baby to suck about 2 3 minutes in order to start the flow of breast milk. Latching Teaching your baby how to latch on to your breast properly is very important. An improper latch can cause nipple pain and decreased milk supply for you and poor weight gain in your baby. Also, if your baby is not latched onto your nipple properly, he or she may swallow some air during feeding. This can make your baby fussy. Burping your baby when you switch breasts during the feeding can help to get rid of the air. However, teaching your baby to latch on properly is still the best way to prevent fussiness from swallowing air while breastfeeding. Signs that your baby has successfully latched on to your nipple:    Silent tugging or silent sucking, without causing you pain.   Swallowing heard between every 3 4 sucks.    Muscle movement above and in front of his or her ears while sucking.  Signs that your baby has not successfully latched on to nipple:   Sucking sounds or smacking sounds from your baby while breastfeeding.  Nipple pain. If you think your baby has not latched on correctly, slip your finger into the corner of your baby's mouth to break the suction and place it between your baby's gums. Attempt breastfeeding initiation again. Signs of  Successful Breastfeeding Signs from your baby:   A gradual decrease in the number of sucks or complete cessation of sucking.   Falling asleep.   Relaxation of his or her body.   Retention of a small amount of milk in his or her mouth.   Letting go of your breast by himself or herself. Signs from you:  Breasts that have increased in firmness, weight, and size 1 3 hours after feeding.   Breasts that are softer immediately after breastfeeding.  Increased milk volume, as well as a change in milk consistency and color by the 5th day of breastfeeding.   Nipples that are not sore, cracked, or bleeding. Signs That Your Pecola Leisure is Getting Enough Milk  Wetting at least 3 diapers in a 24-hour period. The urine should be clear and pale yellow by age 9 days.  At least 3 stools in a 24-hour period by age 9 days. The stool should be soft and yellow.  At least 3 stools in a 24-hour period by age 491 days. The stool should be seedy and yellow.  No loss of weight greater than 10% of birth weight during the first 32 days of age.  Average weight gain of 4 7 ounces (120 210 mL) per week after age 492 days.  Consistent daily weight gain by age 9 days, without weight loss after the age of 2 weeks. After a feeding, your baby may spit up a small amount. This is common. BREASTFEEDING FREQUENCY AND DURATION Frequent feeding will help you make more milk and can prevent sore nipples and breast engorgement. Breastfeed when you feel the need to reduce the fullness of your breasts or when your baby shows signs of hunger. This is called "breastfeeding on demand." Avoid introducing a pacifier to your baby while you are working to establish breastfeeding (the first 4 6 weeks after your baby is born). After this time you may choose to use a pacifier. Research has shown that pacifier use during the first year of a baby's life decreases the risk of sudden infant death syndrome (SIDS). Allow your baby to feed on each  breast as long as he or she wants. Breastfeed until your baby is finished feeding. When your baby unlatches or falls asleep while feeding from the first breast, offer the second breast. Because newborns are often sleepy in the first few weeks of life, you may need to awaken your baby to get him or her to feed. Breastfeeding times will vary from baby to baby. However, the following rules can serve as a guide to help you ensure that your baby is properly fed:  Newborns (babies 43 weeks of age or younger) may breastfeed every 1 3 hours.  Newborns should not go longer than 3 hours during the day or 5 hours during the night without breastfeeding.  You should breastfeed your baby a minimum of 8 times in a 24-hour period until you begin to introduce solid foods to your baby at around 12 months of age. BREAST MILK PUMPING Pumping and storing breast milk allows you to ensure that your baby is exclusively fed your breast milk, even at times when you are unable to breastfeed. This is especially important if you are going back to work while you are still breastfeeding or when you are not able to be present during feedings. Your lactation consultant can give you guidelines on how long it is safe to store breast milk.  A breast pump is a machine that allows you to pump milk from your breast into a sterile bottle. The pumped breast milk can then be stored in a refrigerator or freezer. Some breast pumps are operated by hand, while others use electricity. Ask your lactation consultant which type will work best for you. Breast pumps can be purchased, but some hospitals and breastfeeding support groups lease breast pumps on a monthly basis. A lactation consultant can teach you how to hand express breast milk, if you prefer not to use a pump.  CARING FOR YOUR BREASTS WHILE YOU BREASTFEED Nipples can become dry, cracked, and sore while breastfeeding. The following recommendations can help keep your breasts moisturized and  healthy:  Avoid using soap on your nipples.   Wear a supportive bra. Although not required, special nursing bras and tank tops are designed to allow access to your breasts for breastfeeding without taking off your entire bra or top. Avoid wearing underwire style bras or extremely tight bras.  Air dry your nipples for 3 after each feeding.   Use only cotton bra pads to absorb leaked breast milk. Leaking of breast milk between feedings is normal.   Use lanolin on your nipples after breastfeeding. Lanolin helps to maintain your skin's normal moisture barrier. If you use pure lanolin you do not need to wash it off before feeding your baby again. Pure lanolin is not toxic to your baby. You may also hand express a few drops of breast milk and gently massage that milk into your nipples and allow the milk to air dry. In the first few weeks after giving birth, some women experience extremely full breasts (engorgement). Engorgement can make your breasts feel heavy, warm, and tender to the touch. Engorgement peaks within 3 5 days after you give birth. The following recommendations can help ease engorgement:  Completely empty your breasts while breastfeeding or pumping. You may want to start by applying warm, moist heat (in the shower or with warm water-soaked hand towels) just before feeding or pumping. This increases circulation and helps the milk flow. If your baby does not completely empty your breasts while breastfeeding, pump any extra milk after  he or she is finished.  Wear a snug bra (nursing or regular) or tank top for 1 2 days to signal your body to slightly decrease milk production.  Apply ice packs to your breasts, unless this is too uncomfortable for you.  Make sure that your baby is latched on and positioned properly while breastfeeding. If engorgement persists after 48 hours of following these recommendations, contact your health care provider or a Advertising copywriter. OVERALL  HEALTH CARE RECOMMENDATIONS WHILE BREASTFEEDING  Eat healthy foods. Alternate between meals and snacks, eating 3 of each per day. Because what you eat affects your breast milk, some of the foods may make your baby more irritable than usual. Avoid eating these foods if you are sure that they are negatively affecting your baby.  Drink milk, fruit juice, and water to satisfy your thirst (about 10 glasses a day).   Rest often, relax, and continue to take your prenatal vitamins to prevent fatigue, stress, and anemia.  Continue breast self-awareness checks.  Avoid chewing and smoking tobacco.  Avoid alcohol and drug use. Some medicines that may be harmful to your baby can pass through breast milk. It is important to ask your health care provider before taking any medicine, including all over-the-counter and prescription medicine as well as vitamin and herbal supplements. It is possible to become pregnant while breastfeeding. If birth control is desired, ask your health care provider about options that will be safe for your baby. SEEK MEDICAL CARE IF:   You feel like you want to stop breastfeeding or have become frustrated with breastfeeding.  You have painful breasts or nipples.  Your nipples are cracked or bleeding.  Your breasts are red, tender, or warm.  You have a swollen area on either breast.  You have a fever or chills.  You have nausea or vomiting.  You have drainage other than breast milk from your nipples.  Your breasts do not become full before feedings by the 5th day after you give birth.  You feel sad and depressed.  Your baby is too sleepy to eat well.  Your baby is having trouble sleeping.   Your baby is wetting less than 3 diapers in a 24-hour period.  Your baby has less than 3 stools in a 24-hour period.  Your baby's skin or the white part of his or her eyes becomes yellow.   Your baby is not gaining weight by 42 days of age. SEEK IMMEDIATE MEDICAL CARE  IF:   Your baby is overly tired (lethargic) and does not want to wake up and feed.  Your baby develops an unexplained fever. Document Released: 11/29/2005 Document Revised: 08/01/2013 Document Reviewed: 05/23/2013 [facility] Patient Information 2014 [place], [place].

## 2013-12-31 NOTE — Progress Notes (Signed)
Post discharge chart review completed.  

## 2014-01-10 ENCOUNTER — Encounter: Payer: Self-pay | Admitting: Nurse Practitioner

## 2014-01-10 ENCOUNTER — Encounter: Payer: Self-pay | Admitting: Dentistry

## 2014-01-24 ENCOUNTER — Ambulatory Visit (INDEPENDENT_AMBULATORY_CARE_PROVIDER_SITE_OTHER): Payer: Medicaid Other | Admitting: Nurse Practitioner

## 2014-01-24 ENCOUNTER — Encounter: Payer: Self-pay | Admitting: Nurse Practitioner

## 2014-01-24 VITALS — BP 122/80 | HR 72 | Temp 98.5°F | Ht 60.0 in | Wt 120.2 lb

## 2014-01-24 DIAGNOSIS — Z309 Encounter for contraceptive management, unspecified: Secondary | ICD-10-CM | POA: Insufficient documentation

## 2014-01-24 DIAGNOSIS — Z609 Problem related to social environment, unspecified: Secondary | ICD-10-CM

## 2014-01-24 DIAGNOSIS — Z789 Other specified health status: Secondary | ICD-10-CM | POA: Insufficient documentation

## 2014-01-24 DIAGNOSIS — N898 Other specified noninflammatory disorders of vagina: Secondary | ICD-10-CM

## 2014-01-24 NOTE — Progress Notes (Addendum)
Patient ID: Tina Byrd, female   DOB: 05/26/1986, 28 y.o.   MRN: [id number] History:  Tina Byrd is a 28 y.o. G1P1001 who [facility] today for postpartum care and contraception. She delivered a baby girl four weeks ago. She has an interpreter with her today that speaks Bur[place]ese. She has NSVD with a 3rd degree tear. She has not started her menses and has not resumed intercourse. She would like to start the BCP but does not have Medicaid and can not afford the pill. She is breast and bottle feeding. She and the baby are doing well.   The following portions of the patient's history were reviewed and updated as appropriate: allergies, current medications, past family history, past medical history, past social history, past surgical history and problem list.  Review of Systems:  Pertinent items are noted in HPI.  Objective:  Physical Exam BP 122/80  Pulse 72  Temp(Src) 98.5 F (36.9 C) (Oral)  Ht 5' (1.524 m)  Wt 120 lb 3.2 oz (54.522 kg)  BMI 23.47 kg/m2  Breastfeeding? Yes GENERAL: Well-developed, well-nourished female in no acute distress. Burmese, non English speaking HEENT: Normocephalic, atraumatic.  NECK: Supple. Normal thyroid.  LUNGS: Normal rate. Clear to auscultation bilaterally.  HEART: Regular rate and rhythm with no adventitious sounds.  BREASTS: Symmetric in size. No masses, skin changes, nipple drainage, or lymphadenopathy. ABDOMEN: Soft, nontender, nondistended. No organomegaly. Normal bowel sounds appreciated in all quadrants.  PELVIC: Normal external genitalia that is still healing, including a visible stitch. Vagina is pink and rugated. Discharge is creamy with odor. Normal cervix contour.  Uterus is normal in size. No adnexal mass or tenderness.  EXTREMITIES: No cyanosis, clubbing, or edema, 2+ distal pulses.   Labs and Imaging No results found.    Assessment & Plan:  Assessment:  Postpartum Care Contraception Vaginal  discharge  Plans:  Will use condoms until Medicaid is in place When Medicaid is in place she can be called in [facility] BCP one po qday # 1 pack with 11 refills Will call her if wet prep is abnormal  Delbert PhenixLinda M Angelica Wix, NP 01/24/2014 3:09 PM

## 2014-01-24 NOTE — Patient Instructions (Signed)
Contraception Choices Contraception (birth control) is the use of any methods or devices to prevent pregnancy. Below are some methods to help avoid pregnancy. HORMONAL METHODS   Contraceptive implant This is a thin, plastic tube containing progesterone hormone. It does not contain estrogen hormone. Your health care provider inserts the tube in the inner part of the upper arm. The tube can remain in place for up to 3 years. After 3 years, the implant must be removed. The implant prevents the ovaries from releasing an egg (ovulation), thickens the cervical mucus to prevent sperm from entering the uterus, and thins the lining of the inside of the uterus.  Progesterone-only injections These injections are given every 3 months by your health care provider to prevent pregnancy. This synthetic progesterone hormone stops the ovaries from releasing eggs. It also thickens cervical mucus and changes the uterine lining. This makes it harder for sperm to survive in the uterus.  Birth control pills These pills contain estrogen and progesterone hormone. They work by preventing the ovaries from releasing eggs (ovulation). They also cause the cervical mucus to thicken, preventing the sperm from entering the uterus. Birth control pills are prescribed by a health care provider.Birth control pills can also be used to treat heavy periods.  Minipill This type of birth control pill contains only the progesterone hormone. They are taken every day of each month and must be prescribed by your health care provider.  Birth control patch The patch contains hormones similar to those in birth control pills. It must be changed once a week and is prescribed by a health care provider.  Vaginal ring The ring contains hormones similar to those in birth control pills. It is left in the vagina for 3 weeks, removed for 1 week, and then a new one is put back in place. The patient must be comfortable inserting and removing the ring from the  vagina.A health care provider's prescription is necessary.  Emergency contraception Emergency contraceptives prevent pregnancy after unprotected sexual intercourse. This pill can be taken right after sex or up to 5 days after unprotected sex. It is most effective the sooner you take the pills after having sexual intercourse. Most emergency contraceptive pills are available without a prescription. Check with your pharmacist. Do not use emergency contraception as your only form of birth control. BARRIER METHODS   Female condom This is a thin sheath (latex or rubber) that is worn over the penis during sexual intercourse. It can be used with spermicide to increase effectiveness.  Female condom. This is a soft, loose-fitting sheath that is put into the vagina before sexual intercourse.  Diaphragm This is a soft, latex, dome-shaped barrier that must be fitted by a health care provider. It is inserted into the vagina, along with a spermicidal jelly. It is inserted before intercourse. The diaphragm should be left in the vagina for 6 to 8 hours after intercourse.  Cervical cap This is a round, soft, latex or plastic cup that fits over the cervix and must be fitted by a health care provider. The cap can be left in place for up to 48 hours after intercourse.  Sponge This is a soft, circular piece of polyurethane foam. The sponge has spermicide in it. It is inserted into the vagina after wetting it and before sexual intercourse.  Spermicides These are chemicals that kill or block sperm from entering the cervix and uterus. They come in the form of creams, jellies, suppositories, foam, or tablets. They do not require a   prescription. They are inserted into the vagina with an applicator before having sexual intercourse. The process must be repeated every time you have sexual intercourse. INTRAUTERINE CONTRACEPTION  Intrauterine device (IUD) This is a T-shaped device that is put in a woman's uterus during a  menstrual period to prevent pregnancy. There are 2 types:  Copper IUD This type of IUD is wrapped in copper wire and is placed inside the uterus. Copper makes the uterus and fallopian tubes produce a fluid that kills sperm. It can stay in place for 10 years.  Hormone IUD This type of IUD contains the hormone progestin (synthetic progesterone). The hormone thickens the cervical mucus and prevents sperm from entering the uterus, and it also thins the uterine lining to prevent implantation of a fertilized egg. The hormone can weaken or kill the sperm that get into the uterus. It can stay in place for 3 5 years, depending on which type of IUD is used. PERMANENT METHODS OF CONTRACEPTION  Female tubal ligation This is when the woman's fallopian tubes are surgically sealed, tied, or blocked to prevent the egg from traveling to the uterus.  Hysteroscopic sterilization This involves placing a small coil or insert into each fallopian tube. Your doctor uses a technique called hysteroscopy to do the procedure. The device causes scar tissue to form. This results in permanent blockage of the fallopian tubes, so the sperm cannot fertilize the egg. It takes about 3 months after the procedure for the tubes to become blocked. You must use another form of birth control for these 3 months.  Female sterilization This is when the female has the tubes that carry sperm tied off (vasectomy).This blocks sperm from entering the vagina during sexual intercourse. After the procedure, the man can still ejaculate fluid (semen). NATURAL PLANNING METHODS  Natural family planning This is not having sexual intercourse or using a barrier method (condom, diaphragm, cervical cap) on days the woman could become pregnant.  Calendar method This is keeping track of the length of each menstrual cycle and identifying when you are fertile.  Ovulation method This is avoiding sexual intercourse during ovulation.  Symptothermal method This is  avoiding sexual intercourse during ovulation, using a thermometer and ovulation symptoms.  Post ovulation method This is timing sexual intercourse after you have ovulated. Regardless of which type or method of contraception you choose, it is important that you use condoms to protect against the transmission of sexually transmitted infections (STIs). Talk with your health care provider about which form of contraception is most appropriate for you. Document Released: 11/29/2005 Document Revised: 08/01/2013 Document Reviewed: 05/24/2013 [employer] Patient Information 2014 [employer].  

## 2014-01-25 LAB — WET PREP, GENITAL
CLUE CELLS WET PREP: NONE SEEN
TRICH WET PREP: NONE SEEN
Yeast Wet Prep HPF POC: NONE SEEN

## 2014-10-14 ENCOUNTER — Encounter: Payer: Self-pay | Admitting: Nurse Practitioner

## 2015-11-10 ENCOUNTER — Ambulatory Visit (INDEPENDENT_AMBULATORY_CARE_PROVIDER_SITE_OTHER): Payer: Self-pay | Admitting: Internal Medicine

## 2015-11-10 ENCOUNTER — Encounter: Payer: Self-pay | Admitting: Internal Medicine

## 2015-11-10 VITALS — BP 122/70 | HR 76 | Temp 98.1°F | Resp 20 | Ht 59.75 in | Wt 131.0 lb

## 2015-11-10 DIAGNOSIS — H9202 Otalgia, left ear: Secondary | ICD-10-CM

## 2015-11-10 NOTE — Patient Instructions (Signed)
AFter 2 days, put 3-4 drops of white vinegar in both ears--let it get down in ears and then allow to drain out. Call if your ear continues to hurt by the end of the week.   Do not put anything else in your ear. Stay away from earbuds for at least a week.

## 2015-11-10 NOTE — Progress Notes (Signed)
   Subjective:    Patient ID: Tina Byrd, female    DOB: 06/07/1986, 29 y.o.   MRN: [id number]  HPI  Left ear started hurting 3 nights ago.  Used a Q tip to rub in the ear and started bleeding.  Blood was only on the Qtip.  Does have itching in the ear canal at times.  No drainage.  No definite fever.  Has felt cold, however. No sore throat or recent congestion, cold symptoms. Has never had ear infection in past.  Cannot recall water in her ear recently.   Later, states, did form a cone with cloth and put some coconut oil in her ear as a family member told her it could be dry.    Review of Systems     Objective:   Physical Exam  HEENT:  PERRL, EOMI, TMs pearly gray, external canals are both dry and without any cerumen.  2 areas of abrasion in her left canal, without current bleeding.  No swelling or erythema otherwise. At hairline, behind left ear, has 4 pinsized pustular lesions.  MInimal tenderness. Neck:  Supple, no adenopathy Chest:  CTA CV:  RRR withour murmur or rub        Assessment & Plan:  Left ear pain:  Discussed keeping things out of ear and allowing cerumen to reform.  To allow healing of abraded areas for a couple of days, then to place drops of white vinegar and allow to drain out to improve pH.   To call if no improvement by end of week.

## 2015-11-12 ENCOUNTER — Ambulatory Visit: Payer: Self-pay | Admitting: Internal Medicine

## 2015-11-24 ENCOUNTER — Telehealth: Payer: Self-pay | Admitting: Internal Medicine

## 2015-11-24 NOTE — Telephone Encounter (Signed)
FYI.  Patient called.  Is doing fine - does not need appointment at this time.

## 2015-11-26 NOTE — Telephone Encounter (Signed)
Noted  

## 2016-05-27 DIAGNOSIS — Z719 Counseling, unspecified: Secondary | ICD-10-CM

## 2016-06-03 DIAGNOSIS — R42 Dizziness and giddiness: Secondary | ICD-10-CM

## 2016-06-03 NOTE — Congregational Nurse Program (Signed)
Congregational Nurse Program Note  Date of Encounter: 06/03/2016  Past Medical History: No past medical history on file.  Encounter Details:     CNP Questionnaire - 06/03/16 2331    Patient Demographics   Is this a new or existing patient? New   Patient is considered a/an Refugee   Race Asian   Patient Assistance   Location of Patient [facility]   Patient's financial/insurance status Low Income   Uninsured Patient Yes   Interventions Counseled to make appt. with provider   Patient referred to apply for the following financial assistance [employer]   Food insecurities addressed Not Applicable   Transportation assistance No   Assistance securing medications No   Educational health offerings Health literacy;Navigating the healthcare system   Encounter Details   Primary purpose of visit Acute Illness/Condition Visit;Navigating the Healthcare System   Was an Emergency Department visit averted? Not Applicable   Does patient have a medical provider? No   Patient referred to Medication Assistance Programs   Was a mental health screening completed? (GAINS tool) No   Does patient have dental issues? No   Does patient have vision issues? No   Does your patient have an abnormal blood pressure today? No   Since previous encounter, have you referred patient for abnormal blood pressure that resulted in a new diagnosis or medication change? No   Does your patient have an abnormal blood glucose today? No   Since previous encounter, have you referred patient for abnormal blood glucose that resulted in a new diagnosis or medication change? No   Was there a life-saving intervention made? No    Client reports having dizziness on occasion, BP today was 110/80.  Not on menstrual period.  No dehydration felt.  No fever per client.  Client to hydrate, seek medical care if symptoms worsen.  Will followup next week. Client's daughter was diagnosed with bug  bites from last week.  No hand foot and mouth disease.

## 2016-06-22 ENCOUNTER — Encounter (HOSPITAL_COMMUNITY): Payer: Self-pay | Admitting: Family Medicine

## 2016-06-22 ENCOUNTER — Ambulatory Visit (HOSPITAL_COMMUNITY)
Admission: EM | Admit: 2016-06-22 | Discharge: 2016-06-22 | Disposition: A | Discharge status: Home / Self Care | Payer: Medicaid Other | Attending: Family Medicine | Admitting: Family Medicine

## 2016-06-22 DIAGNOSIS — L259 Unspecified contact dermatitis, unspecified cause: Secondary | ICD-10-CM | POA: Diagnosis not present

## 2016-06-22 MED ORDER — PREDNISONE 20 MG PO TABS: ORAL_TABLET | ORAL | Status: DC

## 2016-06-22 NOTE — ED Provider Notes (Signed)
CSN: [id number]     Arrival date & time 06/22/16  1854 History   First MD Initiated Contact with Patient 06/22/16 1949     Chief Complaint  Patient presents with  . Rash   (Consider location/radiation/quality/duration/timing/severity/associated sxs/prior Treatment) HPI Comments: 30 year old Asian females complaining of a rash for the past 3 days. She does work in the garden but is uncertain as to what she may come in contact with. She is [redacted] weeks pregnant. The rash is located in areas of the skin that would be exposed while wearing light clothing. There is a pattern of a papular vesicular rash to the entire arms, the axilla and the shape of the neck. There are few papules to the abdomen. Lesser to the lower extremities. No lesions were seen on the back. They are very pruritic. Denies systemic symptoms. Denies cough, trouble swallowing or breathing.   History reviewed. No pertinent past medical history. History reviewed. No pertinent past surgical history. Family History  Problem Relation Age of Onset  . Diabetes Mother   . Lung disease Mother     "the blood vessel to lung is too small  . Diabetes Father   . Hypertension Father    Social History  Substance Use Topics  . Smoking status: Never Smoker   . Smokeless tobacco: Current User    Types: Chew  . Alcohol Use: No   OB History    Gravida Para Term Preterm AB TAB SAB Ectopic Multiple Living   2 1 1       1      Review of Systems  Constitutional: Negative.  Negative for fever.  HENT: Negative.   Respiratory: Negative.  Negative for cough and shortness of breath.   Cardiovascular: Negative.   Genitourinary: Negative.   Skin: Positive for rash.  Neurological: Negative.   Psychiatric/Behavioral: Negative.     Allergies  Review of patient's allergies indicates no known allergies.  Home Medications   Prior to Admission medications   Medication Sig Start Date End Date Taking? Authorizing Provider  ibuprofen (ADVIL,MOTRIN)  600 MG tablet Take 1 tablet (600 mg total) by mouth every 6 (six) hours as needed for mild pain, moderate pain or cramping. 12/30/13   Cheral MarkerKimberly R Booker, CNM  predniSONE (DELTASONE) 20 MG tablet Take 1 tab daily for 3 days. Take with food. 06/22/16   Hayden Rasmussenavid Jakiah Goree, NP  Prenatal Vit-Fe Fumarate-FA (PRENATAL MULTIVITAMIN) TABS tablet Take 1 tablet by mouth daily at 12 noon. Patient not taking: Reported on 11/10/2015 12/30/13   Minta BalsamMichael R Odom, MD   Meds Ordered and Administered this Visit  Medications - No data to display  BP 114/66 mmHg  Pulse 71  Temp(Src) 98.2 F (36.8 C)  Resp 18  SpO2 97%  LMP 10/14/2015 (Approximate) No data found.   Physical Exam  Constitutional: She is oriented to person, place, and time. She appears well-developed and well-nourished. No distress.  HENT:  Head: Normocephalic and atraumatic.  Eyes: EOM are normal.  Neck: Normal range of motion. Neck supple.  Cardiovascular: Normal rate.   Pulmonary/Chest: Effort normal. No respiratory distress.  Musculoskeletal: She exhibits no edema.  Neurological: She is alert and oriented to person, place, and time. She exhibits normal muscle tone.  Skin: Skin is warm and dry. Rash noted.  Smaller papular vesicular lesions as described above. Underlying erythematous base. No drainage, bleeding or purulence. The rash to his to follow a pattern similar to that of skin exposed and not covered with clothing.  Psychiatric:  She has a normal mood and affect.  Nursing note and vitals reviewed.   ED Course  Procedures (including critical care time)  Labs Review Labs Reviewed - No data to display  Imaging Review No results found.   Visual Acuity Review  Right Eye Distance:   Left Eye Distance:   Bilateral Distance:    Right Eye Near:   Left Eye Near:    Bilateral Near:         MDM   1. Contact dermatitis    You need to take an antihistamine for your rash. May take Benadryl 50 mg every 4 hours as needed. This may  cause drowsiness. If you want to take an antihistamine that does not cause drowsiness he may take either Zyrtec 10 mg once a day or Claritin 10 mg once a day. Meds ordered this encounter  Medications  . predniSONE (DELTASONE) 20 MG tablet    Sig: Take 1 tab daily for 3 days. Take with food.    Dispense:  3 tablet    Refill:  0    Order Specific Question:  Supervising Provider    Answer:  Linna Hoff [[id number]]       Hayden Rasmussen, NP 06/22/16 2006

## 2016-06-22 NOTE — ED Notes (Signed)
Pt here for rash to stomach, chest, arms since yesterday. sts she did eat fish yesterday. Denies any new lotions, detergents. sts she is [redacted] weeks pregnant.

## 2016-06-22 NOTE — Discharge Instructions (Signed)
Contact Dermatitis You need to take an antihistamine for your rash. May take Benadryl 50 mg every 4 hours as needed. This may cause drowsiness. If you want to take an antihistamine that does not cause drowsiness he may take either Zyrtec 10 mg once a day or Claritin 10 mg once a day. Dermatitis is redness, soreness, and swelling (inflammation) of the skin. Contact dermatitis is a reaction to certain substances that touch the skin. You either touched something that irritated your skin, or you have allergies to something you touched.  HOME CARE  Skin Care  Moisturize your skin as needed.  Apply cool compresses to the affected areas.   Try taking a bath with:   Epsom salts. Follow the instructions on the package. You can get these at a pharmacy or grocery store.   Baking soda. Pour a small amount into the bath as told by your doctor.   Colloidal oatmeal. Follow the instructions on the package. You can get this at a pharmacy or grocery store.   Try applying baking soda paste to your skin. Stir water into baking soda until it looks like paste.  Do not scratch your skin.   Bathe less often.  Bathe in lukewarm water. Avoid using hot water.  Medicines  Take or apply over-the-counter and prescription medicines only as told by your doctor.   If you were prescribed an antibiotic medicine, take or apply your antibiotic as told by your doctor. Do not stop taking the antibiotic even if your condition starts to get better. General Instructions  Keep all follow-up visits as told by your doctor. This is important.   Avoid the substance that caused your reaction. If you do not know what caused it, keep a journal to try to track what caused it. Write down:   What you eat.   What cosmetic products you use.   What you drink.   What you wear in the affected area. This includes jewelry.   If you were given a bandage (dressing), take care of it as told by your doctor. This includes  when to change and remove it.  GET HELP IF:   You do not get better with treatment.   Your condition gets worse.   You have signs of infection such as:  Swelling.  Tenderness.  Redness.  Soreness.  Warmth.   You have a fever.   You have new symptoms.  GET HELP RIGHT AWAY IF:   You have a very bad headache.  You have neck pain.  Your neck is stiff.   You throw up (vomit).   You feel very sleepy.   You see red streaks coming from the affected area.   Your bone or joint underneath the affected area becomes painful after the skin has healed.   The affected area turns darker.   You have trouble breathing.    This information is not intended to replace advice given to you by your health care provider. Make sure you discuss any questions you have with your health care provider.   Document Released: 09/26/2009 Document Revised: 08/20/2015 Document Reviewed: 04/16/2015 Elsevier Interactive Patient Education [employer]2016 Elsevier Inc.

## 2016-07-22 LAB — OB RESULTS CONSOLE GC/CHLAMYDIA
CHLAMYDIA, DNA PROBE: NEGATIVE
Gonorrhea: NEGATIVE

## 2016-07-22 LAB — OB RESULTS CONSOLE ANTIBODY SCREEN: ANTIBODY SCREEN: NEGATIVE

## 2016-07-22 LAB — OB RESULTS CONSOLE HIV ANTIBODY (ROUTINE TESTING): HIV: NONREACTIVE

## 2016-07-22 LAB — OB RESULTS CONSOLE HEPATITIS B SURFACE ANTIGEN: HEP B S AG: NEGATIVE

## 2016-07-22 LAB — OB RESULTS CONSOLE ABO/RH: RH TYPE: POSITIVE

## 2016-07-22 LAB — OB RESULTS CONSOLE RPR: RPR: NONREACTIVE

## 2016-07-22 LAB — OB RESULTS CONSOLE RUBELLA ANTIBODY, IGM: Rubella: IMMUNE

## 2016-09-09 ENCOUNTER — Encounter (HOSPITAL_COMMUNITY): Payer: Self-pay

## 2016-12-13 NOTE — L&D Delivery Note (Signed)
Patient is 31 y.o. G2P1001 [id number] admitted for SOL.    Delivery Note At 4:51 AM a viable female was delivered via Vaginal, Spontaneous Delivery (Presentation: Occiput Anterior) en caul.  APGAR: 8, 9; weight pending.   Placenta status: spontaneous, intact .  Cord:  3 vessels with the following complications: none.    Anesthesia:  None  Episiotomy: None Lacerations: 2nd degree Suture Repair: 2.0 vicryl  Est. Blood Loss (mL): 200  Mom to postpartum.  Baby to Couplet care / Skin to Skin.  De HollingsheadCatherine L Wallace 02/04/2017, 5:22 AM  The above was performed under my direct supervision and guidance.

## 2016-12-31 LAB — OB RESULTS CONSOLE GBS: STREP GROUP B AG: NEGATIVE

## 2017-01-21 ENCOUNTER — Other Ambulatory Visit (HOSPITAL_COMMUNITY): Payer: Self-pay | Admitting: Nurse Practitioner

## 2017-01-21 DIAGNOSIS — Z3A38 38 weeks gestation of pregnancy: Secondary | ICD-10-CM

## 2017-01-21 DIAGNOSIS — Z3689 Encounter for other specified antenatal screening: Secondary | ICD-10-CM

## 2017-01-21 DIAGNOSIS — O26843 Uterine size-date discrepancy, third trimester: Secondary | ICD-10-CM

## 2017-01-24 ENCOUNTER — Other Ambulatory Visit (HOSPITAL_COMMUNITY): Payer: Medicaid Other

## 2017-01-25 ENCOUNTER — Other Ambulatory Visit (HOSPITAL_COMMUNITY): Payer: Self-pay | Admitting: Nurse Practitioner

## 2017-01-25 ENCOUNTER — Ambulatory Visit (HOSPITAL_COMMUNITY)
Admission: RE | Admit: 2017-01-25 | Discharge: 2017-01-25 | Disposition: A | Discharge status: Home / Self Care | Payer: Medicaid Other | Source: Ambulatory Visit | Attending: Nurse Practitioner | Admitting: Nurse Practitioner

## 2017-01-25 ENCOUNTER — Encounter (HOSPITAL_COMMUNITY): Payer: Self-pay | Admitting: *Deleted

## 2017-01-25 DIAGNOSIS — Z3A38 38 weeks gestation of pregnancy: Secondary | ICD-10-CM | POA: Insufficient documentation

## 2017-01-25 DIAGNOSIS — O26843 Uterine size-date discrepancy, third trimester: Secondary | ICD-10-CM

## 2017-01-25 DIAGNOSIS — Z363 Encounter for antenatal screening for malformations: Secondary | ICD-10-CM | POA: Diagnosis present

## 2017-01-25 DIAGNOSIS — Z3689 Encounter for other specified antenatal screening: Secondary | ICD-10-CM

## 2017-01-25 DIAGNOSIS — O0933 Supervision of pregnancy with insufficient antenatal care, third trimester: Secondary | ICD-10-CM | POA: Insufficient documentation

## 2017-02-04 ENCOUNTER — Inpatient Hospital Stay (HOSPITAL_COMMUNITY)
Admission: AD | Admit: 2017-02-04 | Discharge: 2017-02-05 | DRG: 775 | Disposition: A | Discharge status: Home / Self Care | Payer: Medicaid Other | Source: Ambulatory Visit | Attending: Obstetrics and Gynecology | Admitting: Obstetrics and Gynecology

## 2017-02-04 ENCOUNTER — Encounter (HOSPITAL_COMMUNITY): Payer: Self-pay | Admitting: *Deleted

## 2017-02-04 DIAGNOSIS — Z833 Family history of diabetes mellitus: Secondary | ICD-10-CM

## 2017-02-04 DIAGNOSIS — O1002 Pre-existing essential hypertension complicating childbirth: Secondary | ICD-10-CM

## 2017-02-04 DIAGNOSIS — Z3A40 40 weeks gestation of pregnancy: Secondary | ICD-10-CM | POA: Diagnosis not present

## 2017-02-04 DIAGNOSIS — O164 Unspecified maternal hypertension, complicating childbirth: Secondary | ICD-10-CM | POA: Diagnosis present

## 2017-02-04 DIAGNOSIS — Z8249 Family history of ischemic heart disease and other diseases of the circulatory system: Secondary | ICD-10-CM | POA: Diagnosis not present

## 2017-02-04 DIAGNOSIS — O99334 Smoking (tobacco) complicating childbirth: Secondary | ICD-10-CM | POA: Diagnosis present

## 2017-02-04 DIAGNOSIS — Z3493 Encounter for supervision of normal pregnancy, unspecified, third trimester: Secondary | ICD-10-CM | POA: Diagnosis present

## 2017-02-04 LAB — CBC
HCT: 39 % (ref 36.0–46.0)
HCT: 37.3 % (ref 36.0–46.0)
Hemoglobin: 13.9 g/dL (ref 12.0–15.0)
Hemoglobin: 13.2 g/dL (ref 12.0–15.0)
MCH: 30.2 pg (ref 26.0–34.0)
MCH: 30.5 pg (ref 26.0–34.0)
MCHC: 35.6 g/dL (ref 30.0–36.0)
MCHC: 35.4 g/dL (ref 30.0–36.0)
MCV: 84.6 fL (ref 78.0–100.0)
MCV: 86.1 fL (ref 78.0–100.0)
PLATELETS: 299 10*3/uL (ref 150–400)
Platelets: 298 K/uL (ref 150–400)
RBC: 4.61 MIL/uL (ref 3.87–5.11)
RBC: 4.33 MIL/uL (ref 3.87–5.11)
RDW: 15.3 % (ref 11.5–15.5)
RDW: 15.6 % — ABNORMAL HIGH (ref 11.5–15.5)
WBC: 11.1 10*3/uL — AB (ref 4.0–10.5)
WBC: 16.8 K/uL — ABNORMAL HIGH (ref 4.0–10.5)

## 2017-02-04 LAB — COMPREHENSIVE METABOLIC PANEL WITH GFR
ALT: 13 U/L — ABNORMAL LOW (ref 14–54)
AST: 27 U/L (ref 15–41)
Albumin: 2.9 g/dL — ABNORMAL LOW (ref 3.5–5.0)
Alkaline Phosphatase: 137 U/L — ABNORMAL HIGH (ref 38–126)
Anion gap: 8 (ref 5–15)
BUN: 11 mg/dL (ref 6–20)
CO2: 24 mmol/L (ref 22–32)
Calcium: 9.1 mg/dL (ref 8.9–10.3)
Chloride: 102 mmol/L (ref 101–111)
Creatinine, Ser: 0.71 mg/dL (ref 0.44–1.00)
GFR calc Af Amer: >60 mL/min (ref >60)
GFR calc non Af Amer: >60 mL/min (ref >60)
Glucose, Bld: 120 mg/dL — ABNORMAL HIGH (ref 65–99)
Potassium: 4 mmol/L (ref 3.5–5.1)
Sodium: 134 mmol/L — ABNORMAL LOW (ref 135–145)
Total Bilirubin: 0.4 mg/dL (ref 0.3–1.2)
Total Protein: 6.2 g/dL — ABNORMAL LOW (ref 6.5–8.1)

## 2017-02-04 LAB — TYPE AND SCREEN
ABO/RH(D): O POS
Antibody Screen: NEGATIVE

## 2017-02-04 MED ORDER — SOD CITRATE-CITRIC ACID 500-334 MG/5ML PO SOLN: 30.0000 mL | ORAL | Status: DC | PRN

## 2017-02-04 MED ORDER — BISACODYL 10 MG RE SUPP: 10.0000 mg | Freq: Every day | RECTAL | Status: DC | PRN

## 2017-02-04 MED ORDER — FENTANYL CITRATE (PF) 100 MCG/2ML IJ SOLN
50.0000 ug | INTRAMUSCULAR | Status: DC | PRN
  Administered 2017-02-04: 100 ug via INTRAVENOUS
  Filled 2017-02-04: qty 2

## 2017-02-04 MED ORDER — ONDANSETRON HCL 4 MG/2ML IJ SOLN: 4.0000 mg | Freq: Four times a day (QID) | INTRAMUSCULAR | Status: DC | PRN

## 2017-02-04 MED ORDER — LIDOCAINE HCL (PF) 1 % IJ SOLN
30.0000 mL | INTRAMUSCULAR | Status: DC | PRN
  Administered 2017-02-04: 30 mL via SUBCUTANEOUS
  Filled 2017-02-04: qty 30

## 2017-02-04 MED ORDER — BENZOCAINE-MENTHOL 20-0.5 % EX AERO
1.0000 | INHALATION_SPRAY | CUTANEOUS | Status: DC | PRN
Start: 2017-02-04 — End: 2017-02-05
  Administered 2017-02-04: 1 via TOPICAL
  Filled 2017-02-04: qty 56

## 2017-02-04 MED ORDER — WITCH HAZEL-GLYCERIN EX PADS: 1.0000 "application " | MEDICATED_PAD | CUTANEOUS | Status: DC | PRN

## 2017-02-04 MED ORDER — DIPHENHYDRAMINE HCL 25 MG PO CAPS: 25.0000 mg | ORAL_CAPSULE | Freq: Four times a day (QID) | ORAL | Status: DC | PRN

## 2017-02-04 MED ORDER — PRENATAL MULTIVITAMIN CH
1.0000 | ORAL_TABLET | Freq: Every day | ORAL | Status: DC
  Administered 2017-02-04 – 2017-02-05 (×2): 1 via ORAL
  Filled 2017-02-04 (×2): qty 1

## 2017-02-04 MED ORDER — OXYCODONE-ACETAMINOPHEN 5-325 MG PO TABS: 2.0000 | ORAL_TABLET | ORAL | Status: DC | PRN

## 2017-02-04 MED ORDER — OXYTOCIN 40 UNITS IN LACTATED RINGERS INFUSION - SIMPLE MED
2.5000 [IU]/h | INTRAVENOUS | Status: DC
  Filled 2017-02-04: qty 1000

## 2017-02-04 MED ORDER — LACTATED RINGERS IV SOLN
INTRAVENOUS | Status: DC
  Administered 2017-02-04: 04:00:00 via INTRAVENOUS

## 2017-02-04 MED ORDER — DOCUSATE SODIUM 100 MG PO CAPS
100.0000 mg | ORAL_CAPSULE | Freq: Two times a day (BID) | ORAL | Status: DC
  Administered 2017-02-04 – 2017-02-05 (×2): 100 mg via ORAL
  Filled 2017-02-04 (×2): qty 1

## 2017-02-04 MED ORDER — OXYCODONE-ACETAMINOPHEN 5-325 MG PO TABS: 1.0000 | ORAL_TABLET | ORAL | Status: DC | PRN

## 2017-02-04 MED ORDER — ZOLPIDEM TARTRATE 5 MG PO TABS: 5.0000 mg | ORAL_TABLET | Freq: Every evening | ORAL | Status: DC | PRN

## 2017-02-04 MED ORDER — VITAMIN K1 1 MG/0.5ML IJ SOLN
INTRAMUSCULAR | Status: AC
  Filled 2017-02-04: qty 0.5

## 2017-02-04 MED ORDER — COCONUT OIL OIL
1.0000 "application " | TOPICAL_OIL | Status: DC | PRN
  Administered 2017-02-05: 1 via TOPICAL
  Filled 2017-02-04: qty 120

## 2017-02-04 MED ORDER — OXYCODONE HCL 5 MG PO TABS: 10.0000 mg | ORAL_TABLET | ORAL | Status: DC | PRN

## 2017-02-04 MED ORDER — LACTATED RINGERS IV SOLN: 500.0000 mL | INTRAVENOUS | Status: DC | PRN

## 2017-02-04 MED ORDER — MEASLES, MUMPS & RUBELLA VAC ~~LOC~~ INJ
0.5000 mL | INJECTION | Freq: Once | SUBCUTANEOUS | Status: DC
  Filled 2017-02-04: qty 0.5

## 2017-02-04 MED ORDER — TETANUS-DIPHTH-ACELL PERTUSSIS 5-2.5-18.5 LF-MCG/0.5 IM SUSP: 0.5000 mL | Freq: Once | INTRAMUSCULAR | Status: DC

## 2017-02-04 MED ORDER — FERROUS SULFATE 325 (65 FE) MG PO TABS
325.0000 mg | ORAL_TABLET | Freq: Two times a day (BID) | ORAL | Status: DC
  Administered 2017-02-04 – 2017-02-05 (×4): 325 mg via ORAL
  Filled 2017-02-04 (×4): qty 1

## 2017-02-04 MED ORDER — METHYLERGONOVINE MALEATE 0.2 MG/ML IJ SOLN: 0.2000 mg | INTRAMUSCULAR | Status: DC | PRN

## 2017-02-04 MED ORDER — OXYTOCIN BOLUS FROM INFUSION
500.0000 mL | Freq: Once | INTRAVENOUS | Status: AC
  Administered 2017-02-04: 500 mL via INTRAVENOUS

## 2017-02-04 MED ORDER — FLEET ENEMA 7-19 GM/118ML RE ENEM: 1.0000 | ENEMA | RECTAL | Status: DC | PRN

## 2017-02-04 MED ORDER — ONDANSETRON HCL 4 MG/2ML IJ SOLN: 4.0000 mg | INTRAMUSCULAR | Status: DC | PRN

## 2017-02-04 MED ORDER — ACETAMINOPHEN 325 MG PO TABS: 650.0000 mg | ORAL_TABLET | ORAL | Status: DC | PRN

## 2017-02-04 MED ORDER — DIBUCAINE 1 % RE OINT: 1.0000 "application " | TOPICAL_OINTMENT | RECTAL | Status: DC | PRN

## 2017-02-04 MED ORDER — SIMETHICONE 80 MG PO CHEW: 80.0000 mg | CHEWABLE_TABLET | ORAL | Status: DC | PRN

## 2017-02-04 MED ORDER — IBUPROFEN 600 MG PO TABS
600.0000 mg | ORAL_TABLET | Freq: Four times a day (QID) | ORAL | Status: DC
  Administered 2017-02-04 – 2017-02-05 (×7): 600 mg via ORAL
  Filled 2017-02-04 (×7): qty 1

## 2017-02-04 MED ORDER — METHYLERGONOVINE MALEATE 0.2 MG PO TABS: 0.2000 mg | ORAL_TABLET | ORAL | Status: DC | PRN

## 2017-02-04 MED ORDER — FLEET ENEMA 7-19 GM/118ML RE ENEM: 1.0000 | ENEMA | Freq: Every day | RECTAL | Status: DC | PRN

## 2017-02-04 MED ORDER — OXYCODONE HCL 5 MG PO TABS
5.0000 mg | ORAL_TABLET | ORAL | Status: DC | PRN
  Administered 2017-02-04: 5 mg via ORAL
  Filled 2017-02-04: qty 1

## 2017-02-04 MED ORDER — ONDANSETRON HCL 4 MG PO TABS
4.0000 mg | ORAL_TABLET | ORAL | Status: DC | PRN
Start: 2017-02-04 — End: 2017-02-05

## 2017-02-04 NOTE — Lactation Note (Signed)
This note was copied from a baby's chart. Lactation Consultation Note  Patient Name: Tina Byrd ZOXWR'UToday's Date: 02/04/2017 Reason for consult: Initial assessment;Infant < 6lbs  Visited with experienced 2nd time Mom, baby 9 hrs old. Baby born at 40 wks, weighing 5 lbs 9 oz.  Mom sleeping in bed, and baby sleeping in crib. Mom states she has tried to breastfeed baby, she opens widely and falls asleep.  Mom exhausted, her eyes were closing as she spoke.  When Mom more awake, encouraged her to put baby skin to skin on her chest.  To call for assistance as needed for latching. Brochure left in room.  Informed Mom of IP Lactation services available.    Consult Status Consult Status: Follow-up Date: 02/05/17 Follow-up type: In-patient    Tina Byrd, Tina Byrd 02/04/2017, 2:04 PM

## 2017-02-04 NOTE — H&P (Signed)
LABOR ADMISSION HISTORY AND PHYSICAL  Tina Byrd is a 31 y.o. female G2P1001 with IUP at 7571w0d by 18 wk sono presenting for SOL. She reports +FMs, No LOF, no VB, no blurry vision, headaches or peripheral edema, and RUQ pain.  She plans on breast and bottle feeding. She request nexplanon for birth control.  Dating: By [place] Mon18wk sono --->  Estimated Date of Delivery: 02/04/17  Prenatal History/Complications: Tobacco use   Past Medical History: History reviewed. No pertinent past medical history.  Past Surgical History: History reviewed. No pertinent surgical history.  Obstetrical History: OB History    Gravida Para Term Preterm AB Living   2 1 1     1    SAB TAB Ectopic Multiple Live Births           1      Social History: Social History   Social History  . Marital status: Married    Spouse name: Bubba CampSang  . Number of children: 1  . Years of education: 10   Occupational History  . Housewife    Social History Main Topics  . Smoking status: Never Smoker  . Smokeless tobacco: Current User    Types: Chew  . Alcohol use No  . Drug use: No  . Sexual activity: Yes    Birth control/ protection: None   Other Topics Concern  . None   Social History Narrative   Originally from [place]   Was in a camp in [place] for 2 years.   Her husband left 2 sons behind in [place], ages 3311 and 3113.  He had them with a girlfriend in past.   They plan to [place] them to [place] When they have the financial ability to do so.   Also, 2 yo daughter, Lurena JoinerRebecca, born [place]   Lives at home with husband and daughter.    Family History: Family History  Problem Relation Age of Onset  . Diabetes Mother   . Lung disease Mother     "the blood vessel to lung is too small  . Diabetes Father   . Hypertension Father     Allergies: No Known Allergies  Prescriptions Prior to Admission  Medication Sig Dispense Refill Last Dose  . Prenatal Vit-Fe Fumarate-FA (PRENATAL MULTIVITAMIN) TABS tablet Take 1 tablet by  mouth daily at 12 noon. 90 tablet 1 02/03/2017 at Unknown time  . ibuprofen (ADVIL,MOTRIN) 600 MG tablet Take 1 tablet (600 mg total) by mouth every 6 (six) hours as needed for mild pain, moderate pain or cramping. (Patient not taking: Reported on 01/25/2017) 30 tablet 0 Unknown at Unknown time  . predniSONE (DELTASONE) 20 MG tablet Take 1 tab daily for 3 days. Take with food. (Patient not taking: Reported on 01/25/2017) 3 tablet 0 Unknown at Unknown time     Review of Systems   All systems reviewed and negative except as stated in HPI  Blood pressure (!) 135/94, pulse 60, temperature 98.2 F (36.8 C), temperature source Oral, resp. rate 20, height 4\' 11"  (1.499 m), weight 67.6 kg (149 lb), last menstrual period 04/23/2016, currently breastfeeding. General appearance: alert and cooperative Lungs: clear to auscultation bilaterally Heart: regular rate and rhythm Abdomen: soft, non-tender; bowel sounds normal Extremities: Homans sign is negative, no sign of DVT Presentation: cephalic Fetal monitoringBaseline: 130 bpm, Variability: Good {> 6 bpm), Accelerations: Reactive and Decelerations: early, one variable Uterine activityFrequency: Every 3-5 minutes Dilation: 7.5 Effacement (%): 100 Station: 0 Exam by:: Malva CoganA. Schwarz RN    Prenatal labs:  ABO, Rh: O/Positive/-- (08/10 0000) Antibody: Negative (08/10 0000) Rubella: Immune RPR: Nonreactive (08/10 0000)  HBsAg: Negative (08/10 0000)  HIV: Non-reactive (08/10 0000)  GBS: Negative (01/19 0000)  1 hr Glucola 131 Genetic screening  Negative  Anatomy US Normal  Prenatal Transfer Tool  Maternal Diabetes: No Genetic Screening: Normal Maternal Ultrasounds/Referrals: Normal Fetal Ultrasounds or other Referrals:  None Maternal Substance Abuse:  Yes:  Type: Smoker Significant Maternal Medications:  None Significant Maternal Lab Results: Lab values include: Group B Strep negative  Results for orders placed or performed during the hospital  encounter of 02/04/17 (from the past 24 hour(s))  CBC   Collection Time: 02/04/17  3:20 AM  Result Value Ref Range   WBC 11.1 (H) 4.0 - 10.5 K/uL   RBC 4.61 3.87 - 5.11 MIL/uL   Hemoglobin 13.9 12.0 - 15.0 g/dL   HCT 11.9 14.7 - 82.9 %   MCV 84.6 78.0 - 100.0 fL   MCH 30.2 26.0 - 34.0 pg   MCHC 35.6 30.0 - 36.0 g/dL   RDW 56.2 13.0 - 86.5 %   Platelets 299 150 - 400 K/uL    Patient Active Problem List   Diagnosis Date Noted  . Indication for care in labor or delivery 02/04/2017  . Postpartum care and examination 01/24/2014  . Contraception management 01/24/2014  . Language barrier 01/24/2014    Assessment: Tina Byrd is a 31 y.o. G2P1001 at [redacted]w[redacted]d here for SOL.   #Labor:Expectant management  #Pain: Declines epidural, IV pain medications #FWB: Cat I-II #ID:  GBS neg  #MOF: Breast and bottle  #MOC: Nexplanon #Circ:  N/A   De Hollingshead 02/04/2017, 4:26 AM  I have seen and examinied the above pt and agree with the above.

## 2017-02-04 NOTE — MAU Note (Signed)
PT  SAYS UC  STRONG  AT 2300.     PNC  AT  HD.    VE YESTERDAY   3-4   CM.        DENIES HSV AND   MRSA.  GBS- UNSURE

## 2017-02-04 NOTE — Lactation Note (Signed)
This note was copied from a baby's chart. Lactation Consultation Note  Patient Name: Tina Byrd ZOXWR'UToday's Date: 02/04/2017 Reason for consult: Follow-up assessment;Infant < 6lbs Baby has started to wake up and cluster feed. Mom using cradle hold and baby having difficulty sustaining good depth. Assisted Mom with positioning and baby was able to obtain/sustain the depth with latch, demonstrating good suckling bursts.  Mom experienced BF. Continue to BF with feeding ques. Call for assist as needed with latch.   Maternal Data    Feeding Feeding Type: Breast Fed Length of feed: 10 min  LATCH Score/Interventions Latch: Grasps breast easily, tongue down, lips flanged, rhythmical sucking.  Audible Swallowing: A few with stimulation  Type of Nipple: Everted at rest and after stimulation  Comfort (Breast/Nipple): Soft / non-tender     Hold (Positioning): Assistance needed to correctly position infant at breast and maintain latch. Intervention(s): Breastfeeding basics reviewed;Support Pillows;Position options;Skin to skin  LATCH Score: 8  Lactation Tools Discussed/Used     Consult Status Consult Status: Follow-up Date: 02/05/17 Follow-up type: In-patient    Tina Byrd, Tina Byrd Ann 02/04/2017, 10:27 PM

## 2017-02-05 LAB — RPR: RPR Ser Ql: NONREACTIVE

## 2017-02-05 MED ORDER — DOCUSATE SODIUM 100 MG PO CAPS: 100.0000 mg | ORAL_CAPSULE | Freq: Two times a day (BID) | ORAL | 0 refills | Status: DC

## 2017-02-05 NOTE — Lactation Note (Signed)
This note was copied from a baby's chart. Lactation Consultation Note  Patient Name: Tina Fredirick Latheawk Cragg WUJWJ'XToday's Date: 02/05/2017 Reason for consult: Follow-up assessment   Ex Bf.  Baby < 6 lbs.  Mother recently bf for 20 min. Denies problems with breastfeeding.  Mother states she tries to bf on both breasts every feeding. Her nipples are starting to become sore.  No trauma noted. Provided mother with coconut oil. Mom encouraged to feed baby 8-12 times/24 hours and with feeding cues at least every 3 hours. No further questions or concerns.     Maternal Data    Feeding Feeding Type: Breast Fed Length of feed: 20 min  LATCH Score/Interventions                      Lactation Tools Discussed/Used     Consult Status Consult Status: Follow-up Date: 02/06/17 Follow-up type: In-patient    Dahlia ByesBerkelhammer, Ruth [facility]oschen 02/05/2017, 2:52 PM

## 2017-02-05 NOTE — Discharge Summary (Signed)
OB Discharge Summary     Patient Name: Tina Byrd DOB: [birth year] MRN: [id number]  Date of admission: 02/04/2017 Delivering MD: Arvilla Market   Date of discharge: 02/05/2017  Admitting diagnosis: 40wks ctx Intrauterine pregnancy: [redacted]w[redacted]d     Secondary diagnosis:  Active Problems:   Indication for care in labor or delivery  Additional problems: HTN s/p delivery: BPs normalized, CBC/CMET unremarkable      Discharge diagnosis: Term Pregnancy Delivered                                                                                                Post partum procedures:none  Augmentation: none  Complications: None  Hospital course:  Onset of Labor With Vaginal Delivery     31 y.o. yo [id number] at [redacted]w[redacted]d was admitted in Active Labor on 02/04/2017. Patient had an uncomplicated labor course as follows:  Membrane Rupture Time/Date: 4:51 AM ,02/04/2017   Intrapartum Procedures: Episiotomy: None [1]                                         Lacerations:  2nd degree [3]  Patient had a delivery of a Viable infant. 02/04/2017  Information for the patient's newborn:  Herman, Mell Girl Teighan [[id number]]  Delivery Method: Vaginal, Spontaneous Delivery (Filed from Delivery Summary)    Pateint had an uncomplicated postpartum course.  She is ambulating, tolerating a regular diet, passing flatus, and urinating well. Patient is discharged home in stable condition on 02/05/17.   Physical exam  Vitals:   02/04/17 2100 02/04/17 2154 02/04/17 2258 02/05/17 0000  BP: 140/86 132/89 134/81 121/67  Pulse: 75 72 81   Resp:      Temp: 98.4 F (36.9 C)     TempSrc: Oral     Weight:      Height:       General: alert and cooperative Lochia: appropriate Uterine Fundus: firm Incision: N/A DVT Evaluation: No evidence of DVT seen on physical exam. Labs: Lab Results  Component Value Date   WBC 16.8 (H) 02/04/2017   HGB 13.2 02/04/2017   HCT 37.3 02/04/2017   MCV 86.1 02/04/2017   PLT 298  02/04/2017   CMP Latest Ref Rng & Units 02/04/2017  Glucose 65 - 99 mg/dL 130(Q)  BUN 6 - 20 mg/dL 11  Creatinine 6.57 - 8.46 mg/dL 9.62  Sodium 952 - 841 mmol/L 134(L)  Potassium 3.5 - 5.1 mmol/L 4.0  Chloride 101 - 111 mmol/L 102  CO2 22 - 32 mmol/L 24  Calcium 8.9 - 10.3 mg/dL 9.1  Total Protein 6.5 - 8.1 g/dL 6.2(L)  Total Bilirubin 0.3 - 1.2 mg/dL 0.4  Alkaline Phos 38 - 126 U/L 137(H)  AST 15 - 41 U/L 27  ALT 14 - 54 U/L 13(L)    Discharge instruction: per After Visit Summary and "Baby and Me Booklet".  After visit meds:  Allergies as of 02/05/2017   No Known Allergies     Medication List    TAKE these medications  docusate sodium 100 MG capsule Commonly known as:  COLACE Take 1 capsule (100 mg total) by mouth 2 (two) times daily.   prenatal multivitamin Tabs tablet Take 1 tablet by mouth daily at 12 noon.       Diet: routine diet  Activity: Advance as tolerated. Pelvic rest for 6 weeks.   Outpatient follow up:2 weeks Follow up Appt:No future appointments. Follow up Visit:No Follow-up on file.  Postpartum contraception: Nexplanon  Newborn Data: Live born female  Birth Weight: 5 lb 9.6 oz (2540 g) APGAR: 8, 9  Baby Feeding: Bottle and Breast Disposition:home with mother   02/05/2017 De Hollingsheadatherine L Wallace, DO   CNM attestation I have seen and examined this patient and agree with above documentation in the resident's note.   Fredirick Latheawk Nyborg is a 31 y.o. [id number] s/p SVD.   Pain is well controlled.  Plan for birth control is Nexplanon.  Method of Feeding: both  PE:  BP 121/67 (BP Location: Left Arm)   Pulse 81   Temp 98.4 F (36.9 C) (Oral)   Resp 18   Ht 4\' 11"  (1.499 m)   Wt 67.6 kg (149 lb)   LMP 04/23/2016 (Approximate)   Breastfeeding? Unknown   BMI 30.09 kg/m  Fundus firm   Recent Labs  02/04/17 0320 02/04/17 2033  HGB 13.9 13.2  HCT 39.0 37.3     Plan: discharge today - postpartum care discussed - f/u clinic in 4-6 weeks for  postpartum visit, with a Baby Love BP check in 1 week  Cam HaiSHAW, KIMBERLY, CNM 8:45 AM  02/05/2017

## 2017-02-05 NOTE — Discharge Instructions (Signed)

## 2017-02-06 ENCOUNTER — Ambulatory Visit: Payer: Self-pay

## 2017-02-06 NOTE — Lactation Note (Signed)
This note was copied from a baby's chart. Lactation Consultation Note  Baby 1952 hours old.  < 6 lbs.  7.3% weight loss.  Stools have transitioned to yellow per mom. Encouraged mother to breastfeed on both breasts per feeding to increase volume.  Burp/wake in between breasts. Provided mother w/ manual pump and suggest she post pump or hand express for 10-15 min after breastfeeding. Give volume back to baby on spoon until weight stabilizes. Reviewed engorgement care and monitoring voids/stools. Mom encouraged to feed baby 8-12 times/24 hours and with feeding cues at least q 3 hrs.    Patient Name: Tina Byrd: 02/06/2017     Maternal Data    Feeding Feeding Type: Breast Fed Length of feed: 30 min  LATCH Score/Interventions Latch: Repeated attempts needed to sustain latch, nipple held in mouth throughout feeding, stimulation needed to elicit sucking reflex. Intervention(s): Adjust position  Audible Swallowing: Spontaneous and intermittent  Type of Nipple: Everted at rest and after stimulation  Comfort (Breast/Nipple): Filling, red/small blisters or bruises, mild/mod discomfort  Problem noted: Mild/Moderate discomfort Interventions (Mild/moderate discomfort):  (coconut oil)  Hold (Positioning): Assistance needed to correctly position infant at breast and maintain latch. Intervention(s): Position options  LATCH Score: 7  Lactation Tools Discussed/Used     Consult Status      Hardie PulleyBerkelhammer, Dejaun Vidrio Boschen 02/06/2017, 9:33 AM

## 2017-02-07 ENCOUNTER — Ambulatory Visit: Payer: Self-pay

## 2017-02-07 NOTE — Lactation Note (Signed)
This note was copied from a baby's chart. Lactation Consultation Note Follow up visit at 76 hours of age. Baby is 5#4oz, up 1 oz from previous weight check.  Baby has had 11 feedings with 8 stools and 3 voids in the past 24 hours.  Mom is latching baby, but not spoon feeding EBM as encouraged by previous LC. Mom reports full breasts.  Mom has hand pump, but reports it hurts when she pumps.   Rn at bedside to assess baby and then [facility] to assist latching [facility].  Mom has long evert nipples with compressible breasts.  LC discussed pre pumping, and engorgement care in depth.  Mom latched baby to base of nipple and reports pain.  Baby has audible swallows, but LC offered to assist for deeper latch.  Baby latched well with assist and mom thinks baby can't breath, LC assured mom on positioning.  Baby maintained feeding for a few minutes and then sleepy not latching. LC worked with mom to hand pump to soften breasts and then syringe fed about 5mls, baby tolerated well.  Mom does not feel comfortable with syringe feedings.  Due to need to pre or post pump breasts and need to give EBM to baby LC discussed pace bottle feedings and provided mom with a few bottles and nipples to use as needed.  LC encouraged mom to purchase wide based nipples if she continues to offer EBM in bottles.  Mom unsure if she will be discharged today and is awaiting Dr. Visit.  Mom denies further questions and reports feeling confident with discharge today. LC provided mom with written discharge/engorgement care instructions.  LC provided mom with ice packs to use as needed and reviewed cleaning and storage when using hand pump.   LC discussed importance of keeping baby active at one breast for 15-20 minutes to soften breast and allow baby to get hind milk.   Mom aware to call o/p phone as needed.     Patient Name: Tina Byrd VWUJW'JToday's Date: 02/07/2017 Reason for consult: Follow-up assessment   Maternal Data Has patient been  taught Hand Expression?: Yes  Feeding Feeding Type: Breast Fed Length of feed: 5 min  LATCH Score/Interventions Latch: Grasps breast easily, tongue down, lips flanged, rhythmical sucking. Intervention(s): Adjust position;Assist with latch;Breast massage;Breast compression  Audible Swallowing: Spontaneous and intermittent  Type of Nipple: Everted at rest and after stimulation  Comfort (Breast/Nipple): Filling, red/small blisters or bruises, mild/mod discomfort  Problem noted: Mild/Moderate discomfort  Hold (Positioning): Assistance needed to correctly position infant at breast and maintain latch. Intervention(s): Breastfeeding basics reviewed;Support Pillows;Position options;Skin to skin  LATCH Score: 8  Lactation Tools Discussed/Used WIC Program: Yes Pump Review: Setup, frequency, and cleaning;Milk Storage Initiated by:: JS IBCLC Date initiated:: 02/07/17   Consult Status Consult Status: Complete Date: 02/07/17    Jannifer RodneyShoptaw, Enas Winchel Lynn 02/07/2017, 9:48 AM

## 2018-11-28 DIAGNOSIS — Z3042 Encounter for surveillance of injectable contraceptive: Secondary | ICD-10-CM | POA: Diagnosis not present

## 2018-11-28 DIAGNOSIS — Z3200 Encounter for pregnancy test, result unknown: Secondary | ICD-10-CM | POA: Diagnosis not present

## 2018-12-13 NOTE — L&D Delivery Note (Signed)
Delivery Note At 57 a viable female infant was delivered via SVD, presentation: LOP. APGAR: 9, 9; weight pending.   Placenta status: spontaneously delivered intact with gentle cord traction. Fundus firm with massage and Pitocin.   Anesthesia: local Lacerations: 2nd degree perineal Suture used for repair: 3-0 Vicryl rapide Est. Blood Loss (mL): 100 Placenta to LD Complications nuchal x1, tight, reduced through somersault Cord ph n/a   Mom to postpartum. Baby to Couplet care / Skin to Skin.    Julianne Handler, CNM 07/23/2019 1:34 PM

## 2019-01-03 ENCOUNTER — Ambulatory Visit (INDEPENDENT_AMBULATORY_CARE_PROVIDER_SITE_OTHER): Payer: Medicaid Other | Admitting: Obstetrics and Gynecology

## 2019-01-03 ENCOUNTER — Other Ambulatory Visit (HOSPITAL_COMMUNITY)
Admission: RE | Admit: 2019-01-03 | Discharge: 2019-01-03 | Disposition: A | Discharge status: Home / Self Care | Payer: Medicaid Other | Source: Ambulatory Visit | Attending: Obstetrics and Gynecology | Admitting: Obstetrics and Gynecology

## 2019-01-03 ENCOUNTER — Encounter: Payer: Self-pay | Admitting: Obstetrics and Gynecology

## 2019-01-03 DIAGNOSIS — O09521 Supervision of elderly multigravida, first trimester: Secondary | ICD-10-CM

## 2019-01-03 DIAGNOSIS — Z8759 Personal history of other complications of pregnancy, childbirth and the puerperium: Secondary | ICD-10-CM

## 2019-01-03 DIAGNOSIS — Z3481 Encounter for supervision of other normal pregnancy, first trimester: Secondary | ICD-10-CM | POA: Diagnosis not present

## 2019-01-03 DIAGNOSIS — Z3480 Encounter for supervision of other normal pregnancy, unspecified trimester: Secondary | ICD-10-CM

## 2019-01-03 DIAGNOSIS — Z8679 Personal history of other diseases of the circulatory system: Secondary | ICD-10-CM | POA: Insufficient documentation

## 2019-01-03 DIAGNOSIS — Z3490 Encounter for supervision of normal pregnancy, unspecified, unspecified trimester: Secondary | ICD-10-CM | POA: Insufficient documentation

## 2019-01-03 DIAGNOSIS — O0990 Supervision of high risk pregnancy, unspecified, unspecified trimester: Secondary | ICD-10-CM | POA: Insufficient documentation

## 2019-01-03 DIAGNOSIS — O09529 Supervision of elderly multigravida, unspecified trimester: Secondary | ICD-10-CM

## 2019-01-03 MED ORDER — PREPLUS 27-1 MG PO TABS: 1.0000 | ORAL_TABLET | Freq: Every day | ORAL | 13 refills | Status: DC

## 2019-01-03 MED ORDER — DOXYLAMINE-PYRIDOXINE ER 20-20 MG PO TBCR: 1.0000 | EXTENDED_RELEASE_TABLET | Freq: Every day | ORAL | 1 refills | Status: DC

## 2019-01-03 NOTE — Patient Instructions (Signed)
July 31st is your Due Date Today, you are 12 weeks and 5 days

## 2019-01-03 NOTE — Progress Notes (Signed)
New OB Note  01/03/2019   Clinic: [facility]  Chief Complaint: NOB  Transfer of Care Patient: no  History of Present Illness: Ms. MATOUSEK is a 33 y.o. [id number] @ 12/5 weeks (EDC 7/31 (tentative), based on Patient's last menstrual period was 10/06/2018 (exact date).).  Preg complicated by has Language barrier; Supervision of high risk pregnancy, antepartum; History of postpartum hypertension; AMA (advanced maternal age) multigravida 35+; and Encounter for supervision of normal pregnancy, antepartum on their problem list.   Any events prior to today's visit: no Her periods were: regular, qmonth She was using no method when she conceived (pt had nexplanon removed) She has Positive signs or symptoms of nausea/vomiting of pregnancy. She has Negative signs or symptoms of miscarriage or preterm labor  ROS: A 12-point review of systems was performed and negative, except as stated in the above HPI.  OBGYN History: As per HPI. OB History  Gravida Para Term Preterm AB Living  3 2 2     2   SAB TAB Ectopic Multiple Live Births        0 2    # Outcome Date GA Lbr Len/2nd Weight Sex Delivery Anes PTL Lv  3 Current           2 Term 02/04/17 [redacted]w[redacted]d 02:23 / 00:13 5 lb 9.6 oz (2.54 kg) F Vag-Spont Other  LIV  1 Term 12/28/13 [redacted]w[redacted]d 03:32 / 00:24 5 lb 13 oz (2.637 kg) F Vag-Spont Local  LIV     Birth Comments: WNL    Any issues with any prior pregnancies: PP HTN (non severe) Prior children are healthy, doing well, and without any problems or issues: yes History of pap smears: Yes. Last pap smear 2015 and results were NILM   Past Medical History: History reviewed. No pertinent past medical history.  Past Surgical History: History reviewed. No pertinent surgical history.  Family History:  Family History  Problem Relation Age of Onset  . Diabetes Mother   . Lung disease Mother        "the blood vessel to lung is too small  . Diabetes Father   . Hypertension Father     Social History:  Social  History   Socioeconomic History  . Marital status: Married    Spouse name: Bubba Camp  . Number of children: 1  . Years of education: 18  . Highest education level: Not on file  Occupational History  . Occupation: Housewife  Social Needs  . Financial resource strain: Not hard at all  . Food insecurity:    Worry: Never true    Inability: Never true  . Transportation needs:    Medical: No    Non-medical: No  Tobacco Use  . Smoking status: Never Smoker  . Smokeless tobacco: Never Used  Substance and Sexual Activity  . Alcohol use: No    Alcohol/week: 0.0 standard drinks  . Drug use: No  . Sexual activity: Yes    Birth control/protection: None  Lifestyle  . Physical activity:    Days per week: 7 days    Minutes per session: 150+ min  . Stress: Only a little  Relationships  . Social connections:    Talks on phone: More than three times a week    Gets together: More than three times a week    Attends religious service: More than 4 times per year    Active member of club or organization: No    Attends meetings of clubs or organizations: Never  Relationship status: Not on file  . Intimate partner violence:    Fear of current or ex partner: No    Emotionally abused: No    Physically abused: No    Forced sexual activity: No  Other Topics Concern  . Not on file  Social History Narrative   Originally from [place]   Was in a camp in [place] for 2 years.   Her husband left 2 sons behind in [place], ages 1911 and 7313.  He had them with a girlfriend in past.   They plan to [place] them to [place] When they have the financial ability to do so.   Also, 2 yo daughter, Lurena JoinerRebecca, born [place]   Lives at home with husband and daughter.    Allergy: No Known Allergies  Health Maintenance:  Mammogram Up to Date: not applicable  Current Outpatient Medications: PNV Physical Exam:   BP 100/65   Pulse 69   Wt 134 lb 12.8 oz (61.1 kg)   LMP 10/06/2018 (Exact Date)   BMI 27.23 kg/m  Body  mass index is 27.23 kg/m. Contractions: Not present Vag. Bleeding: None. Fundal height: not applicable FHTs: 170s  General appearance: Well nourished, well developed female in no acute distress.  Neck:  Supple, normal appearance, and no thyromegaly  Cardiovascular: S1, S2 normal, no murmur, rub or gallop, regular rate and rhythm Respiratory:  Clear to auscultation bilateral. Normal respiratory effort Abdomen: positive bowel sounds and no masses, hernias; diffusely non tender to palpation, non distended Breasts: breasts appear normal, no suspicious masses, no skin or nipple changes or axillary nodes, and normal palpation. Neuro/Psych:  Normal mood and affect.  Skin:  Warm and dry.  Lymphatic:  No inguinal lymphadenopathy.   Pelvic exam: is not limited by body habitus EGBUS: within normal limits, Vagina: within normal limits and with no blood in the vault, Cervix: normal appearing cervix without discharge or lesions, closed/long/high, Uterus:  enlarged, c/w 12-14 week size, and Adnexa:  normal adnexa and no mass, fullness, tenderness  Laboratory: none  Imaging:  none  Assessment: pt doing well  Plan: 1. Antepartum multigravida of advanced maternal age Routine care. Okay for panorama. Declines GC at this time. Offer afp nv - Comprehensive metabolic panel - Protein / creatinine ratio, urine - Hemoglobin A1c - TSH  2. Supervision of other normal pregnancy, antepartum Routine labs. Requested anatomy u/s in 6 weeks. Pt amenable to bonjesta (no vomiting, just nausea). Speaks english, declined interpreter - Culture, OB Urine - Obstetric Panel, Including HIV - Cytology - PAP( [facility]) - SMN1 Copy Number Analysis - Comprehensive metabolic panel - Protein / creatinine ratio, urine - Hemoglobin A1c - TSH - US MFM OB DETAIL +14 WK; Future  Problem list reviewed and updated.  Follow up in 3 weeks.  The nature of [facility] with multiple  MDs and other Advanced Practice Providers was explained to patient; also emphasized that residents, students are part of our team.  >50% of 25 min visit spent on counseling and coordination of care.     Cornelia Copaharlie Elisabet Gutzmer, Jr. MD Attending Center for [facility](Faculty Practice)

## 2019-01-04 LAB — PROTEIN / CREATININE RATIO, URINE
CREATININE, UR: 51.2 mg/dL
PROTEIN UR: 8.2 mg/dL
PROTEIN/CREAT RATIO: 160 mg/g{creat} (ref 0–200)

## 2019-01-06 LAB — URINE CULTURE, OB REFLEX

## 2019-01-06 LAB — CULTURE, OB URINE

## 2019-01-09 LAB — CYTOLOGY - PAP
CHLAMYDIA, DNA PROBE: NEGATIVE
Diagnosis: NEGATIVE
HPV (WINDOPATH): NOT DETECTED
NEISSERIA GONORRHEA: NEGATIVE

## 2019-01-11 ENCOUNTER — Other Ambulatory Visit: Payer: Self-pay

## 2019-01-11 ENCOUNTER — Encounter: Payer: Self-pay | Admitting: Obstetrics and Gynecology

## 2019-01-11 DIAGNOSIS — O0990 Supervision of high risk pregnancy, unspecified, unspecified trimester: Secondary | ICD-10-CM

## 2019-01-11 NOTE — Progress Notes (Signed)
error 

## 2019-01-11 NOTE — Addendum Note (Signed)
Addended by: Natale Milch D on: 01/11/2019 01:39 PM   Modules accepted: Orders

## 2019-01-13 LAB — COMPREHENSIVE METABOLIC PANEL
ALBUMIN: 4.6 g/dL (ref 3.8–4.8)
ALT: 17 IU/L (ref 0–32)
AST: 12 IU/L (ref 0–40)
Albumin/Globulin Ratio: 1.8 (ref 1.2–2.2)
Alkaline Phosphatase: 74 IU/L (ref 39–117)
BUN/Creatinine Ratio: 10 (ref 9–23)
BUN: 6 mg/dL (ref 6–20)
Bilirubin Total: 0.5 mg/dL (ref 0.0–1.2)
CALCIUM: 9.7 mg/dL (ref 8.7–10.2)
CO2: 22 mmol/L (ref 20–29)
CREATININE: 0.59 mg/dL (ref 0.57–1.00)
Chloride: 103 mmol/L (ref 96–106)
GFR calc Af Amer: 138 mL/min/{1.73_m2} (ref >59)
GFR, EST NON AFRICAN AMERICAN: 120 mL/min/{1.73_m2} (ref >59)
GLUCOSE: 68 mg/dL (ref 65–99)
Globulin, Total: 2.5 g/dL (ref 1.5–4.5)
Potassium: 5.3 mmol/L — ABNORMAL HIGH (ref 3.5–5.2)
Sodium: 139 mmol/L (ref 134–144)
Total Protein: 7.1 g/dL (ref 6.0–8.5)

## 2019-01-13 LAB — SMN1 COPY NUMBER ANALYSIS (SMA CARRIER SCREENING)

## 2019-01-13 LAB — OBSTETRIC PANEL, INCLUDING HIV
Antibody Screen: NEGATIVE
Basophils Absolute: 0 10*3/uL (ref 0.0–0.2)
Basos: 0 %
EOS (ABSOLUTE): 0.1 10*3/uL (ref 0.0–0.4)
EOS: 1 %
HEP B S AG: NEGATIVE
HIV Screen 4th Generation wRfx: NONREACTIVE
Hematocrit: 40.2 % (ref 34.0–46.6)
Hemoglobin: 13.4 g/dL (ref 11.1–15.9)
IMMATURE GRANULOCYTES: 0 %
Immature Grans (Abs): 0 10*3/uL (ref 0.0–0.1)
LYMPHS ABS: 2.1 10*3/uL (ref 0.7–3.1)
Lymphs: 24 %
MCH: 29 pg (ref 26.6–33.0)
MCHC: 33.3 g/dL (ref 31.5–35.7)
MCV: 87 fL (ref 79–97)
MONOCYTES: 9 %
Monocytes Absolute: 0.8 10*3/uL (ref 0.1–0.9)
NEUTROS PCT: 66 %
Neutrophils Absolute: 5.6 10*3/uL (ref 1.4–7.0)
Platelets: 397 10*3/uL (ref 150–450)
RBC: 4.62 x10E6/uL (ref 3.77–5.28)
RDW: 13.1 % (ref 11.7–15.4)
RPR: NONREACTIVE
RUBELLA: 4.06 {index} (ref >0.99)
Rh Factor: POSITIVE
WBC: 8.6 10*3/uL (ref 3.4–10.8)

## 2019-01-13 LAB — HEMOGLOBIN A1C
Est. average glucose Bld gHb Est-mCnc: 114 mg/dL
Hgb A1c MFr Bld: 5.6 % (ref 4.8–5.6)

## 2019-01-13 LAB — TSH: TSH: 4.17 u[IU]/mL (ref 0.450–4.500)

## 2019-01-24 ENCOUNTER — Other Ambulatory Visit: Payer: Self-pay

## 2019-01-24 ENCOUNTER — Ambulatory Visit (INDEPENDENT_AMBULATORY_CARE_PROVIDER_SITE_OTHER): Payer: Medicaid Other | Admitting: Obstetrics and Gynecology

## 2019-01-24 VITALS — BP 106/70 | HR 80 | Wt 134.0 lb

## 2019-01-24 DIAGNOSIS — O0990 Supervision of high risk pregnancy, unspecified, unspecified trimester: Secondary | ICD-10-CM

## 2019-01-24 DIAGNOSIS — Z23 Encounter for immunization: Secondary | ICD-10-CM | POA: Diagnosis not present

## 2019-01-24 DIAGNOSIS — Z8679 Personal history of other diseases of the circulatory system: Secondary | ICD-10-CM

## 2019-01-24 DIAGNOSIS — O09522 Supervision of elderly multigravida, second trimester: Secondary | ICD-10-CM

## 2019-01-24 DIAGNOSIS — O0992 Supervision of high risk pregnancy, unspecified, second trimester: Secondary | ICD-10-CM

## 2019-01-24 DIAGNOSIS — Z8759 Personal history of other complications of pregnancy, childbirth and the puerperium: Secondary | ICD-10-CM

## 2019-01-24 DIAGNOSIS — Z3482 Encounter for supervision of other normal pregnancy, second trimester: Secondary | ICD-10-CM

## 2019-01-24 DIAGNOSIS — Z3480 Encounter for supervision of other normal pregnancy, unspecified trimester: Secondary | ICD-10-CM | POA: Diagnosis not present

## 2019-01-24 MED ORDER — ASPIRIN EC 81 MG PO TBEC: 81.0000 mg | DELAYED_RELEASE_TABLET | Freq: Every day | ORAL | 2 refills | Status: DC

## 2019-01-24 NOTE — Progress Notes (Signed)
ROB.  FLU vaccine given in RD, tolerated well.

## 2019-01-24 NOTE — Patient Instructions (Signed)

## 2019-01-24 NOTE — Progress Notes (Signed)
Subjective:  Tina Byrd is a 33 y.o. G3P2002 at [redacted]w[redacted]d being seen today for ongoing prenatal care.  She is currently monitored for the following issues for this high-risk pregnancy and has Supervision of high risk pregnancy, antepartum; History of postpartum hypertension; AMA (advanced maternal age) multigravida 35+; and Encounter for supervision of normal pregnancy, antepartum on their problem list.  Patient reports no complaints.  Contractions: Not present. Vag. Bleeding: None.   . Denies leaking of fluid.   The following portions of the patient's history were reviewed and updated as appropriate: allergies, current medications, past family history, past medical history, past social history, past surgical history and problem list. Problem list updated.  Objective:   Vitals:   01/24/19 1556  BP: 106/70  Pulse: 80  Weight: 134 lb (60.8 kg)    Fetal Status: Fetal Heart Rate (bpm): 169         General:  Alert, oriented and cooperative. Patient is in no acute distress.  Skin: Skin is warm and dry. No rash noted.   Cardiovascular: Normal heart rate noted  Respiratory: Normal respiratory effort, no problems with respiration noted  Abdomen: Soft, gravid, appropriate for gestational age. Pain/Pressure: Present     Pelvic:  Cervical exam deferred        Extremities: Normal range of motion.  Edema: None  Mental Status: Normal mood and affect. Normal behavior. Normal judgment and thought content.   Urinalysis:      Assessment and Plan:  Pregnancy: G3P2002 at [redacted]w[redacted]d  1. Supervision of other normal pregnancy, antepartum Stable Flu vaccine today - AFP, Serum, Open Spina Bifida  2. Multigravida of advanced maternal age in second trimester LR NIPS Anatomy scan next month AFP today  4. History of postpartum hypertension Will start BASA qd Reviewed indications with pt - aspirin EC 81 MG tablet; Take 1 tablet (81 mg total) by mouth daily. Take after 12 weeks for prevention of preeclampsia  later in pregnancy  Dispense: 300 tablet; Refill: 2  Preterm labor symptoms and general obstetric precautions including but not limited to vaginal bleeding, contractions, leaking of fluid and fetal movement were reviewed in detail with the patient. Please refer to After Visit Summary for other counseling recommendations.  Return in about 4 weeks (around 02/21/2019) for OB visit.   Tina Staggers, MD

## 2019-02-02 LAB — AFP, SERUM, OPEN SPINA BIFIDA
AFP MoM: 2.29
AFP Value: 78.6 ng/mL
Gest. Age on Collection Date: 15.7 weeks
Maternal Age At EDD: 36 yr
OSBR Risk 1 IN: 435
TEST RESULTS AFP: NEGATIVE
Weight: 134 [lb_av]

## 2019-02-14 ENCOUNTER — Other Ambulatory Visit (HOSPITAL_COMMUNITY): Payer: Self-pay | Admitting: *Deleted

## 2019-02-14 ENCOUNTER — Encounter: Payer: Self-pay | Admitting: Obstetrics and Gynecology

## 2019-02-14 ENCOUNTER — Encounter (HOSPITAL_COMMUNITY): Payer: Self-pay

## 2019-02-14 ENCOUNTER — Ambulatory Visit (HOSPITAL_COMMUNITY): Payer: Medicaid Other | Admitting: *Deleted

## 2019-02-14 ENCOUNTER — Ambulatory Visit (HOSPITAL_COMMUNITY)
Admission: RE | Admit: 2019-02-14 | Discharge: 2019-02-14 | Disposition: A | Discharge status: Home / Self Care | Payer: Medicaid Other | Source: Ambulatory Visit | Attending: Obstetrics and Gynecology | Admitting: Obstetrics and Gynecology

## 2019-02-14 VITALS — BP 111/67 | HR 78 | Wt 131.0 lb

## 2019-02-14 DIAGNOSIS — O09522 Supervision of elderly multigravida, second trimester: Secondary | ICD-10-CM

## 2019-02-14 DIAGNOSIS — Z3480 Encounter for supervision of other normal pregnancy, unspecified trimester: Secondary | ICD-10-CM | POA: Diagnosis not present

## 2019-02-14 DIAGNOSIS — Z363 Encounter for antenatal screening for malformations: Secondary | ICD-10-CM

## 2019-02-14 DIAGNOSIS — Z3A16 16 weeks gestation of pregnancy: Secondary | ICD-10-CM | POA: Diagnosis not present

## 2019-02-14 DIAGNOSIS — O09523 Supervision of elderly multigravida, third trimester: Secondary | ICD-10-CM | POA: Diagnosis not present

## 2019-02-14 DIAGNOSIS — O4440 Low lying placenta NOS or without hemorrhage, unspecified trimester: Secondary | ICD-10-CM

## 2019-02-14 DIAGNOSIS — Z362 Encounter for other antenatal screening follow-up: Secondary | ICD-10-CM

## 2019-02-21 ENCOUNTER — Ambulatory Visit (INDEPENDENT_AMBULATORY_CARE_PROVIDER_SITE_OTHER): Payer: Medicaid Other | Admitting: Obstetrics & Gynecology

## 2019-02-21 ENCOUNTER — Other Ambulatory Visit: Payer: Self-pay

## 2019-02-21 DIAGNOSIS — Z3482 Encounter for supervision of other normal pregnancy, second trimester: Secondary | ICD-10-CM

## 2019-02-21 DIAGNOSIS — Z3480 Encounter for supervision of other normal pregnancy, unspecified trimester: Secondary | ICD-10-CM

## 2019-02-21 DIAGNOSIS — Z3A17 17 weeks gestation of pregnancy: Secondary | ICD-10-CM

## 2019-02-21 NOTE — Progress Notes (Signed)
   PRENATAL VISIT NOTE  Subjective:  Tina Byrd is a 33 y.o. G3P2002 at [redacted]w[redacted]d being seen today for ongoing prenatal care.  She is currently monitored for the following issues for this high-risk pregnancy and has History of postpartum hypertension; AMA (advanced maternal age) multigravida 35+; Encounter for supervision of normal pregnancy, antepartum; and Low-lying placenta on their problem list.  Patient reports no complaints.  Contractions: Not present. Vag. Bleeding: None.  Movement: Present. Denies leaking of fluid.   The following portions of the patient's history were reviewed and updated as appropriate: allergies, current medications, past family history, past medical history, past social history, past surgical history and problem list.   Objective:   Vitals:   02/21/19 0920  BP: 118/75  Pulse: 97  Weight: 60.3 kg    Fetal Status: Fetal Heart Rate (bpm): 165   Movement: Present     General:  Alert, oriented and cooperative. Patient is in no acute distress.  Skin: Skin is warm and dry. No rash noted.   Cardiovascular: Normal heart rate noted  Respiratory: Normal respiratory effort, no problems with respiration noted  Abdomen: Soft, gravid, appropriate for gestational age.  Pain/Pressure: Absent     Pelvic: Cervical exam deferred        Extremities: Normal range of motion.  Edema: None  Mental Status: Normal mood and affect. Normal behavior. Normal judgment and thought content.   Assessment and Plan:  Pregnancy: G3P2002 at [redacted]w[redacted]d 1. Supervision of other normal pregnancy, antepartum Screening for ONTD. Had nl NIPS - AFP, Serum, Open Spina Bifida  Preterm labor symptoms and general obstetric precautions including but not limited to vaginal bleeding, contractions, leaking of fluid and fetal movement were reviewed in detail with the patient. Please refer to After Visit Summary for other counseling recommendations.   Return in about 4 weeks (around 03/21/2019).  Future  Appointments  Date Time Provider Department Center  03/14/2019 11:15 AM [facility] [place] 4 [facility]-MFCUS MFC-US  03/22/2019  3:30 PM Adam Phenix, MD [facility] None    Scheryl Darter, MD

## 2019-02-21 NOTE — Patient Instructions (Signed)

## 2019-02-21 NOTE — Progress Notes (Signed)
Patient reports fetal movement, denies pain. 

## 2019-02-23 LAB — AFP, SERUM, OPEN SPINA BIFIDA
AFP MoM: 4.26
AFP Value: 163.1 ng/mL
Gest. Age on Collection Date: 17.4 weeks
Maternal Age At EDD: 35.2 yr
OSBR Risk 1 IN: 18
TEST RESULTS AFP: POSITIVE — AB
Weight: 160 [lb_av]

## 2019-03-01 ENCOUNTER — Telehealth: Payer: Self-pay

## 2019-03-01 DIAGNOSIS — O0990 Supervision of high risk pregnancy, unspecified, unspecified trimester: Secondary | ICD-10-CM

## 2019-03-02 ENCOUNTER — Other Ambulatory Visit: Payer: Self-pay | Admitting: Obstetrics and Gynecology

## 2019-03-02 NOTE — Telephone Encounter (Signed)
S/w pt and advised of AFP results, and reminded her to keep upcoming appts. Pt agreed.

## 2019-03-14 ENCOUNTER — Ambulatory Visit (HOSPITAL_COMMUNITY): Payer: Medicaid Other

## 2019-03-22 ENCOUNTER — Other Ambulatory Visit: Payer: Self-pay

## 2019-03-22 ENCOUNTER — Ambulatory Visit (INDEPENDENT_AMBULATORY_CARE_PROVIDER_SITE_OTHER): Payer: Medicaid Other | Admitting: Obstetrics & Gynecology

## 2019-03-22 DIAGNOSIS — Z3A21 21 weeks gestation of pregnancy: Secondary | ICD-10-CM | POA: Diagnosis not present

## 2019-03-22 DIAGNOSIS — Z3480 Encounter for supervision of other normal pregnancy, unspecified trimester: Secondary | ICD-10-CM

## 2019-03-22 DIAGNOSIS — Z3482 Encounter for supervision of other normal pregnancy, second trimester: Secondary | ICD-10-CM | POA: Diagnosis not present

## 2019-03-22 MED ORDER — BLOOD PRESSURE MONITORING KIT: PACK | 0 refills | Status: DC

## 2019-03-22 NOTE — Patient Instructions (Signed)

## 2019-03-22 NOTE — Progress Notes (Signed)
Pt states she needs form fill out to receive a different kind of milk than what [facility] is providing her. Pt informed forms need to be dropped off to the office to be completed then we could fax the forms

## 2019-03-22 NOTE — Progress Notes (Signed)
   TELEHEALTH VIRTUAL OBSTETRICS VISIT ENCOUNTER NOTE  I connected with Tina Byrd on 03/22/19 at  3:30 PM EDT by telephone at home and verified that I am speaking with the correct person using two identifiers.   I discussed the limitations, risks, security and privacy concerns of performing an evaluation and management service by telephone and the availability of in person appointments. I also discussed with the patient that there may be a patient responsible charge related to this service. The patient expressed understanding and agreed to proceed.  Subjective:  Tina Byrd is a 33 y.o. G3P2002 at 27w5dbeing followed for ongoing prenatal care.  She is currently monitored for the following issues for this high-risk pregnancy and has History of postpartum hypertension; AMA (advanced maternal age) multigravida 35+; Encounter for supervision of normal pregnancy, antepartum; and Low-lying placenta on their problem list.  Patient reports no complaints. Reports fetal movement. Denies any contractions, bleeding or leaking of fluid.   The following portions of the patient's history were reviewed and updated as appropriate: allergies, current medications, past family history, past medical history, past social history, past surgical history and problem list.   Objective:   General:  Alert, oriented and cooperative.   Mental Status: Normal mood and affect perceived. Normal judgment and thought content.  Rest of physical exam deferred due to type of encounter  Assessment and Plan:  Pregnancy: G3P2002 at 255w5d. Supervision of other normal pregnancy, antepartum F/u Us[place]orean 2 weeks. Take ASA 81 mg daily  - Babyscripts Schedule Optimization - Blood Pressure Monitoring KIT; Take BP Weekly  Dispense: 1 kit; Refill: 0  Preterm labor symptoms and general obstetric precautions including but not limited to vaginal bleeding, contractions, leaking of fluid and fetal movement were reviewed in detail with the  patient.  I discussed the assessment and treatment plan with the patient. The patient was provided an opportunity to ask questions and all were answered. The patient agreed with the plan and demonstrated an understanding of the instructions. The patient was advised to call back or seek an in-person office evaluation/[facility]. Please refer to After Visit Summary for other counseling recommendations.   I provided 10 minutes of non-face-to-face time during this encounter.  Return in about 6 weeks (around 05/03/2019) for 2 hr GTT in person.  Future Appointments  Date Time Provider [place]4/24/2020  3:30 PM [facility]  04/06/2019  3:30 PM [facility]  Ja[place]eterio Reeve[place] [facility]

## 2019-04-06 ENCOUNTER — Ambulatory Visit (HOSPITAL_COMMUNITY)
Admission: RE | Admit: 2019-04-06 | Discharge: 2019-04-06 | Disposition: A | Discharge status: Home / Self Care | Payer: Medicaid Other | Source: Ambulatory Visit | Attending: Obstetrics and Gynecology | Admitting: Obstetrics and Gynecology

## 2019-04-06 ENCOUNTER — Ambulatory Visit (HOSPITAL_COMMUNITY): Payer: Medicaid Other | Admitting: *Deleted

## 2019-04-06 ENCOUNTER — Other Ambulatory Visit: Payer: Self-pay

## 2019-04-06 ENCOUNTER — Encounter (HOSPITAL_COMMUNITY): Payer: Self-pay

## 2019-04-06 VITALS — BP 116/70 | HR 96 | Temp 98.8°F

## 2019-04-06 DIAGNOSIS — O09529 Supervision of elderly multigravida, unspecified trimester: Secondary | ICD-10-CM | POA: Insufficient documentation

## 2019-04-06 DIAGNOSIS — Z362 Encounter for other antenatal screening follow-up: Secondary | ICD-10-CM | POA: Insufficient documentation

## 2019-04-06 DIAGNOSIS — Z3A23 23 weeks gestation of pregnancy: Secondary | ICD-10-CM

## 2019-04-06 DIAGNOSIS — O09522 Supervision of elderly multigravida, second trimester: Secondary | ICD-10-CM

## 2019-04-09 ENCOUNTER — Other Ambulatory Visit (HOSPITAL_COMMUNITY): Payer: Self-pay | Admitting: *Deleted

## 2019-04-09 DIAGNOSIS — O09523 Supervision of elderly multigravida, third trimester: Secondary | ICD-10-CM

## 2019-05-03 ENCOUNTER — Other Ambulatory Visit: Payer: Medicaid Other

## 2019-05-03 ENCOUNTER — Ambulatory Visit (INDEPENDENT_AMBULATORY_CARE_PROVIDER_SITE_OTHER): Payer: Medicaid Other | Admitting: Obstetrics and Gynecology

## 2019-05-03 ENCOUNTER — Other Ambulatory Visit: Payer: Self-pay

## 2019-05-03 ENCOUNTER — Encounter: Payer: Self-pay | Admitting: Obstetrics and Gynecology

## 2019-05-03 VITALS — BP 107/69 | HR 73 | Temp 97.1°F | Wt 139.2 lb

## 2019-05-03 DIAGNOSIS — Z3A27 27 weeks gestation of pregnancy: Secondary | ICD-10-CM

## 2019-05-03 DIAGNOSIS — O09522 Supervision of elderly multigravida, second trimester: Secondary | ICD-10-CM

## 2019-05-03 DIAGNOSIS — Z8759 Personal history of other complications of pregnancy, childbirth and the puerperium: Secondary | ICD-10-CM

## 2019-05-03 DIAGNOSIS — Z23 Encounter for immunization: Secondary | ICD-10-CM

## 2019-05-03 DIAGNOSIS — Z3480 Encounter for supervision of other normal pregnancy, unspecified trimester: Secondary | ICD-10-CM

## 2019-05-03 DIAGNOSIS — O4440 Low lying placenta NOS or without hemorrhage, unspecified trimester: Secondary | ICD-10-CM

## 2019-05-03 DIAGNOSIS — O4442 Low lying placenta NOS or without hemorrhage, second trimester: Secondary | ICD-10-CM

## 2019-05-03 DIAGNOSIS — Z8679 Personal history of other diseases of the circulatory system: Secondary | ICD-10-CM

## 2019-05-03 NOTE — Progress Notes (Signed)
   PRENATAL VISIT NOTE  Subjective:  Tina Byrd is a 33 y.o. G3P2002 at [redacted]w[redacted]d being seen today for ongoing prenatal care.  She is currently monitored for the following issues for this low-risk pregnancy and has History of postpartum hypertension; AMA (advanced maternal age) multigravida 35+; Encounter for supervision of normal pregnancy, antepartum; and Low-lying placenta on their problem list.  Patient reports occasional tightening.  Contractions: Not present. Vag. Bleeding: None.  Movement: Present. Denies leaking of fluid.   The following portions of the patient's history were reviewed and updated as appropriate: allergies, current medications, past family history, past medical history, past social history, past surgical history and problem list.   Objective:   Vitals:   05/03/19 0826  BP: 107/69  Pulse: 73  Temp: (!) 97.1 F (36.2 C)  Weight: 139 lb 3.2 oz (63.1 kg)   Fetal Status: Fetal Heart Rate (bpm): 150   Movement: Present     General:  Alert, oriented and cooperative. Patient is in no acute distress.  Skin: Skin is warm and dry. No rash noted.   Cardiovascular: Normal heart rate noted  Respiratory: Normal respiratory effort, no problems with respiration noted  Abdomen: Soft, gravid, appropriate for gestational age.  Pain/Pressure: Present     Pelvic: Cervical exam deferred        Extremities: Normal range of motion.  Edema: None  Mental Status: Normal mood and affect. Normal behavior. Normal judgment and thought content.   Assessment and Plan:  Pregnancy: G3P2002 at [redacted]w[redacted]d  1. Supervision of other normal pregnancy, antepartum - Glucose Tolerance, 2 Hours w/1 Hour - HIV Antibody (routine testing w rflx) - RPR - CBC - desires Nexplanon post delivery in hospital  2. Low-lying placenta Resolved on 04/06/19 scan  3. Multigravida of advanced maternal age in second trimester  4. History of postpartum hypertension   Preterm labor symptoms and general obstetric  precautions including but not limited to vaginal bleeding, contractions, leaking of fluid and fetal movement were reviewed in detail with the patient. Please refer to After Visit Summary for other counseling recommendations.   Return in about 2 weeks (around 05/17/2019) for OB visit, virtual.  Future Appointments  Date Time Provider Department Center  06/01/2019  1:45 PM [facility]-MFC [place] 2 WH-MFCUS MFC-US    Conan Bowens, MD

## 2019-05-04 LAB — CBC
Hematocrit: 35.2 % (ref 34.0–46.6)
Hemoglobin: 12 g/dL (ref 11.1–15.9)
MCH: 30.8 pg (ref 26.6–33.0)
MCHC: 34.1 g/dL (ref 31.5–35.7)
MCV: 90 fL (ref 79–97)
Platelets: 294 10*3/uL (ref 150–450)
RBC: 3.9 x10E6/uL (ref 3.77–5.28)
RDW: 13.1 % (ref 11.7–15.4)
WBC: 8.4 10*3/uL (ref 3.4–10.8)

## 2019-05-04 LAB — GLUCOSE TOLERANCE, 2 HOURS W/ 1HR
Glucose, 1 hour: 153 mg/dL (ref 65–179)
Glucose, 2 hour: 100 mg/dL (ref 65–152)
Glucose, Fasting: 77 mg/dL (ref 65–91)

## 2019-05-04 LAB — RPR: RPR Ser Ql: NONREACTIVE

## 2019-05-04 LAB — HIV ANTIBODY (ROUTINE TESTING W REFLEX): HIV Screen 4th Generation wRfx: NONREACTIVE

## 2019-05-17 ENCOUNTER — Ambulatory Visit (INDEPENDENT_AMBULATORY_CARE_PROVIDER_SITE_OTHER): Payer: Medicaid Other | Admitting: Obstetrics and Gynecology

## 2019-05-17 ENCOUNTER — Other Ambulatory Visit: Payer: Self-pay

## 2019-05-17 ENCOUNTER — Encounter: Payer: Self-pay | Admitting: Obstetrics and Gynecology

## 2019-05-17 VITALS — BP 105/65 | HR 76

## 2019-05-17 DIAGNOSIS — Z8679 Personal history of other diseases of the circulatory system: Secondary | ICD-10-CM

## 2019-05-17 DIAGNOSIS — O09523 Supervision of elderly multigravida, third trimester: Secondary | ICD-10-CM

## 2019-05-17 DIAGNOSIS — Z8759 Personal history of other complications of pregnancy, childbirth and the puerperium: Secondary | ICD-10-CM

## 2019-05-17 DIAGNOSIS — Z3A29 29 weeks gestation of pregnancy: Secondary | ICD-10-CM

## 2019-05-17 DIAGNOSIS — Z3480 Encounter for supervision of other normal pregnancy, unspecified trimester: Secondary | ICD-10-CM

## 2019-05-17 NOTE — Progress Notes (Signed)
   TELEHEALTH OBSTETRICS PRENATAL VIRTUAL VIDEO VISIT ENCOUNTER NOTE  Provider location: Center for [facility] at [place]   I connected with Tina Byrd on 05/17/19 at  2:30 PM EDT by WebEx Video Encounter at home and verified that I am speaking with the correct person using two identifiers.   I discussed the limitations, risks, security and privacy concerns of performing an evaluation and management service by telephone and the availability of in person appointments. I also discussed with the patient that there may be a patient responsible charge related to this service. The patient expressed understanding and agreed to proceed. Subjective:  Tina Byrd is a 33 y.o. G3P2002 at [redacted]w[redacted]d being seen today for ongoing prenatal care.  She is currently monitored for the following issues for this low-risk pregnancy and has History of postpartum hypertension; AMA (advanced maternal age) multigravida 35+; Encounter for supervision of normal pregnancy, antepartum; and Low-lying placenta on their problem list.  Patient reports occasional tightening, specifically when she carries heavy objects.  Contractions: Irritability. Vag. Bleeding: None.  Movement: Present. Denies any leaking of fluid.   The following portions of the patient's history were reviewed and updated as appropriate: allergies, current medications, past family history, past medical history, past social history, past surgical history and problem list.   Objective:   Vitals:   05/17/19 1439  BP: 105/65  Pulse: 76    Fetal Status:     Movement: Present     General:  Alert, oriented and cooperative. Patient is in no acute distress.  Respiratory: Normal respiratory effort, no problems with respiration noted  Mental Status: Normal mood and affect. Normal behavior. Normal judgment and thought content.  Rest of physical exam deferred due to type of encounter  Imaging: No results found.  Assessment and Plan:  Pregnancy: G3P2002 at  [redacted]w[redacted]d  1. Supervision of other normal pregnancy, antepartum Reviewed normal 3rd trim labs  2. Multigravida of advanced maternal age in third trimester  3. History of postpartum hypertension  Preterm labor symptoms and general obstetric precautions including but not limited to vaginal bleeding, contractions, leaking of fluid and fetal movement were reviewed in detail with the patient. I discussed the assessment and treatment plan with the patient. The patient was provided an opportunity to ask questions and all were answered. The patient agreed with the plan and demonstrated an understanding of the instructions. The patient was advised to call back or seek an in-person office evaluation/go to [facility]U at [facility] for any urgent or concerning symptoms. Please refer to After Visit Summary for other counseling recommendations.   I provided 14 minutes of face-to-face time during this encounter.  Return in about 2 weeks (around 05/31/2019) for OB visit, virtual.  Future Appointments  Date Time Provider Department Center  06/01/2019  1:45 PM WH-MFC [place] 2 WH-MFCUS MFC-US    Tina Bowens, MD Center for [facility], [facility]

## 2019-05-30 ENCOUNTER — Other Ambulatory Visit: Payer: Self-pay

## 2019-05-30 ENCOUNTER — Ambulatory Visit (INDEPENDENT_AMBULATORY_CARE_PROVIDER_SITE_OTHER): Payer: Medicaid Other | Admitting: Obstetrics

## 2019-05-30 ENCOUNTER — Encounter: Payer: Self-pay | Admitting: Obstetrics

## 2019-05-30 VITALS — BP 118/82 | HR 95

## 2019-05-30 DIAGNOSIS — Z3A31 31 weeks gestation of pregnancy: Secondary | ICD-10-CM

## 2019-05-30 DIAGNOSIS — Z8679 Personal history of other diseases of the circulatory system: Secondary | ICD-10-CM

## 2019-05-30 DIAGNOSIS — O0993 Supervision of high risk pregnancy, unspecified, third trimester: Secondary | ICD-10-CM

## 2019-05-30 DIAGNOSIS — Z8759 Personal history of other complications of pregnancy, childbirth and the puerperium: Secondary | ICD-10-CM

## 2019-05-30 DIAGNOSIS — O0990 Supervision of high risk pregnancy, unspecified, unspecified trimester: Secondary | ICD-10-CM

## 2019-05-30 DIAGNOSIS — O09293 Supervision of pregnancy with other poor reproductive or obstetric history, third trimester: Secondary | ICD-10-CM

## 2019-05-30 DIAGNOSIS — O09523 Supervision of elderly multigravida, third trimester: Secondary | ICD-10-CM

## 2019-05-30 NOTE — Progress Notes (Signed)
   TELEHEALTH OBSTETRICS PRENATAL VIRTUAL VIDEO VISIT ENCOUNTER NOTE  Provider location: Center for [place] at [place]   I connected with Tina Byrd on 05/30/19 at  9:30 AM EDT by WebEx OB MyChart Video Encounter at home and verified that I am speaking with the correct person using two identifiers.   I discussed the limitations, risks, security and privacy concerns of performing an evaluation and management service by telephone and the availability of in person appointments. I also discussed with the patient that there may be a patient responsible charge related to this service. The patient expressed understanding and agreed to proceed. Subjective:  Tina Byrd is a 33 y.o. G3P2002 at [redacted]w[redacted]d being seen today for ongoing prenatal care.  She is currently monitored for the following issues for this high-risk pregnancy and has History of postpartum hypertension; AMA (advanced maternal age) multigravida 35+; Encounter for supervision of normal pregnancy, antepartum; and Low-lying placenta on their problem list.  Patient reports no complaints.  Contractions: Not present. Vag. Bleeding: None.  Movement: Present. Denies any leaking of fluid.   The following portions of the patient's history were reviewed and updated as appropriate: allergies, current medications, past family history, past medical history, past social history, past surgical history and problem list.   Objective:   Vitals:   05/30/19 1006  BP: 118/82  Pulse: 95    Fetal Status:     Movement: Present     General:  Alert, oriented and cooperative. Patient is in no acute distress.  Respiratory: Normal respiratory effort, no problems with respiration noted  Mental Status: Normal mood and affect. Normal behavior. Normal judgment and thought content.  Rest of physical exam deferred due to type of encounter  Imaging: No results found.  Assessment and Plan:  Pregnancy: G3P2002 at [redacted]w[redacted]d 1. Supervision of high risk  pregnancy, antepartum  2. Multigravida of advanced maternal age in third trimester  3. History of postpartum hypertension   Preterm labor symptoms and general obstetric precautions including but not limited to vaginal bleeding, contractions, leaking of fluid and fetal movement were reviewed in detail with the patient. I discussed the assessment and treatment plan with the patient. The patient was provided an opportunity to ask questions and all were answered. The patient agreed with the plan and demonstrated an understanding of the instructions. The patient was advised to call back or seek an in-person office evaluation/go to MAU at [facility] for any urgent or concerning symptoms. Please refer to After Visit Summary for other counseling recommendations.   I provided 10 minutes of face-to-face time during this encounter.  Return in about 2 weeks (around 06/13/2019) for webex.  Future Appointments  Date Time Provider [place]  06/01/2019  1:45 PM WH-MFC [place] 2 WH-MFCUS MFC-US    Charles Harper, [place] for [facility] [place] [facility] 05-30-2019

## 2019-05-30 NOTE — Progress Notes (Signed)
WebEx OB.  Reports no problems.

## 2019-06-01 ENCOUNTER — Ambulatory Visit (HOSPITAL_COMMUNITY)
Admission: RE | Admit: 2019-06-01 | Discharge: 2019-06-01 | Disposition: A | Discharge status: Home / Self Care | Payer: Medicaid Other | Source: Ambulatory Visit | Attending: Obstetrics and Gynecology | Admitting: Obstetrics and Gynecology

## 2019-06-01 ENCOUNTER — Other Ambulatory Visit: Payer: Self-pay

## 2019-06-01 DIAGNOSIS — O09523 Supervision of elderly multigravida, third trimester: Secondary | ICD-10-CM

## 2019-06-01 DIAGNOSIS — Z3A31 31 weeks gestation of pregnancy: Secondary | ICD-10-CM | POA: Diagnosis not present

## 2019-06-01 DIAGNOSIS — Z362 Encounter for other antenatal screening follow-up: Secondary | ICD-10-CM

## 2019-06-13 ENCOUNTER — Encounter: Payer: Self-pay | Admitting: Certified Nurse Midwife

## 2019-06-13 ENCOUNTER — Ambulatory Visit (INDEPENDENT_AMBULATORY_CARE_PROVIDER_SITE_OTHER): Payer: Medicaid Other | Admitting: Certified Nurse Midwife

## 2019-06-13 VITALS — BP 112/78 | HR 83

## 2019-06-13 DIAGNOSIS — Z3480 Encounter for supervision of other normal pregnancy, unspecified trimester: Secondary | ICD-10-CM

## 2019-06-13 DIAGNOSIS — O09523 Supervision of elderly multigravida, third trimester: Secondary | ICD-10-CM

## 2019-06-13 DIAGNOSIS — M545 Low back pain, unspecified: Secondary | ICD-10-CM

## 2019-06-13 DIAGNOSIS — Z3A33 33 weeks gestation of pregnancy: Secondary | ICD-10-CM

## 2019-06-13 DIAGNOSIS — O26893 Other specified pregnancy related conditions, third trimester: Secondary | ICD-10-CM

## 2019-06-13 NOTE — Progress Notes (Signed)
[place] VIRTUAL VIDEO VISIT ENCOUNTER NOTE  Provider location: [facility] [place]   I connected with Tina Byrd on 06/13/19 at  9:27 AM EDT by WebEx Video Encounter at home and verified that I am speaking with the correct person using two identifiers.   I discussed the limitations, risks, security and privacy concerns of performing an evaluation and management service by telephone and the availability of in person appointments. I also discussed with the patient that there may be a patient responsible charge related to this service. The patient expressed understanding and agreed to proceed. Subjective:  Tina Byrd is a 33 y.o. G3P2002 at [redacted]w[redacted]d being seen today for ongoing prenatal care.  She is currently monitored for the following issues for this high-risk pregnancy and has History of postpartum hypertension; AMA (advanced maternal age) multigravida 35+; Encounter for supervision of normal pregnancy, antepartum; and Low-lying placenta on their problem list.  Patient reports backache.  Contractions: Irritability. Vag. Bleeding: None.  Movement: Present. Denies any leaking of fluid.   The following portions of the patient's history were reviewed and updated as appropriate: allergies, current medications, past family history, past medical history, past social history, past surgical history and problem list.   Objective:   Vitals:   06/13/19 0912  BP: 112/78  Pulse: 83    Fetal Status:     Movement: Present     General:  Alert, oriented and cooperative. Patient is in no acute distress.  Respiratory: Normal respiratory effort, no problems with respiration noted  Mental Status: Normal mood and affect. Normal behavior. Normal judgment and thought content.  Rest of physical exam deferred due to type of encounter  Imaging: [place] Mfm Ob Follow Up  Result Date: 06/01/2019 ----------------------------------------------------------------------   OBSTETRICS REPORT                       (Signed Final 06/01/2019 02:32 pm) ---------------------------------------------------------------------- Patient Info  ID #:       [id number]                          D.O.B.:  1986-08-25 (33 yrs)  Name:       Tina Byrd                    Visit Date: 06/01/2019 01:53 pm ---------------------------------------------------------------------- Performed By  Performed By:     Ellin Saba          Ref. Address:     [address]                    [place], [place]  [id number]  Attending:        Lin Landsmanorenthian Booker      [facility]                                       [facility]  Referred By:      Bradley BingHARLIE                    PICKENS MD ---------------------------------------------------------------------- Orders   #  Description                          Code         Ordered By   1  US MFM OB FOLLOW UP                  [id number]     Lin LandsmanORENTHIAN                                                        BOOKER  ----------------------------------------------------------------------   #  Order #                    Accession #                 Episode #   1  [id number]                  [id number]                  [id number]  ---------------------------------------------------------------------- Indications   [redacted] weeks gestation of pregnancy                Z3A.31   Antenatal follow-up for nonvisualized fetal    Z36.2   anatomy   Advanced maternal age multigravida 8435+,        80O09.522   second trimester (33 at delivery)  ---------------------------------------------------------------------- Vital Signs  Weight (lb): 139                               Height:        4'11"  BMI:         28.07 ---------------------------------------------------------------------- Fetal  Evaluation  Num Of Fetuses:         1  Fetal Heart Rate(bpm):  153  Cardiac Activity:       Observed  Presentation:           Cephalic  Placenta:               Posterior  P. Cord Insertion:      Visualized, central  Amniotic Fluid  AFI FV:      Within normal limits  AFI Sum(cm)     %Tile       Largest Pocket(cm)  14.89           52          4.4  RUQ(cm)       RLQ(cm)  LUQ(cm)        LLQ(cm)  3.93          3.39          4.4            3.17 ---------------------------------------------------------------------- Biometry  BPD:      82.7  mm     G. Age:  33w 2d         81  %    CI:        74.51   %    70 - 86                                                          FL/HC:      19.3   %    19.1 - 21.3  HC:      304.1  mm     G. Age:  33w 6d         66  %    HC/AC:      1.15        0.96 - 1.17  AC:      265.2  mm     G. Age:  30w 4d         17  %    FL/BPD:     70.9   %    71 - 87  FL:       58.6  mm     G. Age:  30w 4d         10  %    FL/AC:      22.1   %    20 - 24  HUM:      52.5  mm     G. Age:  30w 4d         25  %  LV:        7.2  mm  Est. FW:    1707  gm    3 lb 12 oz      42  % ---------------------------------------------------------------------- OB History  Gravidity:    3         Term:   2  Living:       2 ---------------------------------------------------------------------- Gestational Age  LMP:           34w 0d        Date:  10/06/18                 EDD:   07/13/19  U/S Today:     32w 1d                                        EDD:   07/26/19  Best:          31w 6d     Det. By:  U/S  (02/14/19)          EDD:   07/28/19 ---------------------------------------------------------------------- Anatomy  Cranium:               Appears normal         LVOT:                   Previously seen  Cavum:  Appears normal         Aortic Arch:            Previously seen  Ventricles:            Appears normal         Ductal Arch:            Previously seen  Choroid Plexus:        Appears normal          Diaphragm:              Appears normal  Cerebellum:            Appears normal         Stomach:                Appears normal, left                                                                        sided  Posterior Fossa:       Appears normal         Abdomen:                Previously seen  Nuchal Fold:           Not applicable (>20    Abdominal Wall:         Previously seen                         wks GA)  Face:                  Appears normal         Cord Vessels:           Previously seen                         (orbits and profile)  Lips:                  Previously seen        Kidneys:                Previously seen  Palate:                Previously seen        Bladder:                Previously seen  Thoracic:              Appears normal         Spine:                  Previously seen  Heart:                 Previously seen        Upper Extremities:      Previously seen  RVOT:                  Previously seen        Lower Extremities:      Previously seen  Other:  Heels and 5th digit previously seen. ---------------------------------------------------------------------- Cervix Uterus Adnexa  Cervix  Not visualized (advanced GA >24wks)  Left Ovary  Within normal limits.  Right Ovary  Not visualized.  Adnexa  No abnormality visualized. ---------------------------------------------------------------------- Impression  Normal interval growth. ---------------------------------------------------------------------- Recommendations  Follow up as clinically indicated. ----------------------------------------------------------------------               Lin Landsman, MD Electronically Signed Final Report   06/01/2019 02:32 pm ----------------------------------------------------------------------   Assessment and Plan:  Pregnancy: Z6X0960 at [redacted]w[redacted]d 1. Supervision of other normal pregnancy, antepartum BP: 112/78 today  - Patient doing well, reports occasional back pain during pregnancy  - Routine prenatal  care - Anticipatory guidance on upcoming appointments   2. Multigravida of advanced maternal age in third trimester - high risk d/t AMA otherwise low risk   3. Low back pain during pregnancy in third trimester - Patient reports back pain, does not take any medication- reports she lays down and changes position then it resolves  - Educated on safe medications during pregnancy and can use tylenol for pain if needed  Preterm labor symptoms and general obstetric precautions including but not limited to vaginal bleeding, contractions, leaking of fluid and fetal movement were reviewed in detail with the patient. I discussed the assessment and treatment plan with the patient. The patient was provided an opportunity to ask questions and all were answered. The patient agreed with the plan and demonstrated an understanding of the instructions. The patient was advised to call back or seek an in-person office evaluation/go to MAU at [facility] for any urgent or concerning symptoms. Please refer to After Visit Summary for other counseling recommendations.   I provided 10 minutes of face-to-face time during this encounter.  Return in about 19 days (around 07/02/2019) for ROB/GBS.  Future Appointments  Date Time Provider Department Center  07/02/2019  9:15 AM Constant, Gigi Gin, MD [facility] None    Sharyon Cable, CNM [facility] [employer], [facility]

## 2019-06-13 NOTE — Progress Notes (Signed)
I connected with Tina Byrd on 06/13/19 at  9:00 AM EDT by telephone and verified that I am speaking with the correct person using two identifiers.  Pt c/o low back pain.

## 2019-07-02 ENCOUNTER — Other Ambulatory Visit: Payer: Self-pay

## 2019-07-02 ENCOUNTER — Encounter: Payer: Self-pay | Admitting: Obstetrics and Gynecology

## 2019-07-02 ENCOUNTER — Other Ambulatory Visit (HOSPITAL_COMMUNITY)
Admission: RE | Admit: 2019-07-02 | Discharge: 2019-07-02 | Disposition: A | Discharge status: Home / Self Care | Payer: Medicaid Other | Source: Ambulatory Visit | Attending: Obstetrics and Gynecology | Admitting: Obstetrics and Gynecology

## 2019-07-02 ENCOUNTER — Ambulatory Visit (INDEPENDENT_AMBULATORY_CARE_PROVIDER_SITE_OTHER): Payer: Medicaid Other | Admitting: Obstetrics and Gynecology

## 2019-07-02 VITALS — BP 105/69 | HR 78 | Wt 144.1 lb

## 2019-07-02 DIAGNOSIS — Z3480 Encounter for supervision of other normal pregnancy, unspecified trimester: Secondary | ICD-10-CM

## 2019-07-02 DIAGNOSIS — O09523 Supervision of elderly multigravida, third trimester: Secondary | ICD-10-CM

## 2019-07-02 DIAGNOSIS — Z3A36 36 weeks gestation of pregnancy: Secondary | ICD-10-CM

## 2019-07-02 MED ORDER — NYSTATIN 100000 UNIT/GM EX CREA: 1.0000 "application " | TOPICAL_CREAM | Freq: Two times a day (BID) | CUTANEOUS | 1 refills | Status: DC

## 2019-07-02 NOTE — Progress Notes (Signed)
   PRENATAL VISIT NOTE  Subjective:  Tina Byrd is a 33 y.o. G3P2002 at [redacted]w[redacted]d being seen today for ongoing prenatal care.  She is currently monitored for the following issues for this low-risk pregnancy and has History of postpartum hypertension; AMA (advanced maternal age) multigravida 78+; Encounter for supervision of normal pregnancy, antepartum; and Low-lying placenta on their problem list.  Patient reports no complaints.  Contractions: Irritability. Vag. Bleeding: None.  Movement: Present. Denies leaking of fluid.   The following portions of the patient's history were reviewed and updated as appropriate: allergies, current medications, past family history, past medical history, past social history, past surgical history and problem list.   Objective:   Vitals:   07/02/19 0908  BP: 105/69  Pulse: 78  Weight: 144 lb 1.6 oz (65.4 kg)    Fetal Status: Fetal Heart Rate (bpm): 138 Fundal Height: 36 cm Movement: Present  Presentation: Vertex  General:  Alert, oriented and cooperative. Patient is in no acute distress.  Skin: Skin is warm and dry. No rash noted.   Cardiovascular: Normal heart rate noted  Respiratory: Normal respiratory effort, no problems with respiration noted  Abdomen: Soft, gravid, appropriate for gestational age.  Pain/Pressure: Present     Pelvic: Cervical exam performed Dilation: 2 Effacement (%): 50 Station: -3  Extremities: Normal range of motion.  Edema: None  Mental Status: Normal mood and affect. Normal behavior. Normal judgment and thought content.   Assessment and Plan:  Pregnancy: G3P2002 at [redacted]w[redacted]d 1. Supervision of other normal pregnancy, antepartum Patient is doing well without complaints Cultures today - Cervicovaginal ancillary only( [place]) - Culture, beta strep (group b only)  2. Multigravida of advanced maternal age in third trimester   Preterm labor symptoms and general obstetric precautions including but not limited to vaginal  bleeding, contractions, leaking of fluid and fetal movement were reviewed in detail with the patient. Please refer to After Visit Summary for other counseling recommendations.   Return in about 1 week (around 07/09/2019) for [facility], [place].  No future appointments.  Mora Bellman, MD

## 2019-07-02 NOTE — Progress Notes (Signed)
Pt is here for ROB. [redacted]w[redacted]d.  

## 2019-07-03 LAB — CERVICOVAGINAL ANCILLARY ONLY
Chlamydia: NEGATIVE
Neisseria Gonorrhea: NEGATIVE

## 2019-07-06 LAB — CULTURE, BETA STREP (GROUP B ONLY): Strep Gp B Culture: NEGATIVE

## 2019-07-09 ENCOUNTER — Ambulatory Visit (INDEPENDENT_AMBULATORY_CARE_PROVIDER_SITE_OTHER): Payer: Medicaid Other | Admitting: Obstetrics & Gynecology

## 2019-07-09 ENCOUNTER — Encounter: Payer: Self-pay | Admitting: Obstetrics & Gynecology

## 2019-07-09 DIAGNOSIS — Z3480 Encounter for supervision of other normal pregnancy, unspecified trimester: Secondary | ICD-10-CM

## 2019-07-09 DIAGNOSIS — Z8679 Personal history of other diseases of the circulatory system: Secondary | ICD-10-CM

## 2019-07-09 DIAGNOSIS — Z8759 Personal history of other complications of pregnancy, childbirth and the puerperium: Secondary | ICD-10-CM

## 2019-07-09 DIAGNOSIS — O4443 Low lying placenta NOS or without hemorrhage, third trimester: Secondary | ICD-10-CM

## 2019-07-09 DIAGNOSIS — O09523 Supervision of elderly multigravida, third trimester: Secondary | ICD-10-CM

## 2019-07-09 DIAGNOSIS — O09529 Supervision of elderly multigravida, unspecified trimester: Secondary | ICD-10-CM

## 2019-07-09 DIAGNOSIS — Z3A37 37 weeks gestation of pregnancy: Secondary | ICD-10-CM

## 2019-07-09 DIAGNOSIS — O4440 Low lying placenta NOS or without hemorrhage, unspecified trimester: Secondary | ICD-10-CM

## 2019-07-09 NOTE — Progress Notes (Signed)
I connected with Blayne Allinson on 07/09/19 at  9:45 AM EDT by telephone and verified that I am speaking with the correct person using two identifiers.  Pt has BP cuff but she is not at home right now. She is still taking Baby ASA.

## 2019-07-09 NOTE — Progress Notes (Signed)
   [place] VIRTUAL VIDEO VISIT ENCOUNTER NOTE  Provider location: [facility] [place] at [place]   I connected with Tina Byrd on 07/09/19 at  9:45 AM EDT by WebEx Video Encounter at home and verified that I am speaking with the correct person using two identifiers.   I discussed the limitations, risks, security and privacy concerns of performing an evaluation and management service virtually and the availability of in person appointments. I also discussed with the patient that there may be a patient responsible charge related to this service. The patient expressed understanding and agreed to proceed. Subjective:  Tina Byrd is a 33 y.o. G3P2002 at [redacted]w[redacted]d being seen today for ongoing prenatal care.  She is currently monitored for the following issues for this high-risk pregnancy and has History of postpartum hypertension; AMA (advanced maternal age) multigravida 35+; Encounter for supervision of normal pregnancy, antepartum; and Low-lying placenta on their problem list.  Patient reports no complaints.  Contractions: Irritability. Vag. Bleeding: None.  Movement: Present. Denies any leaking of fluid.   The following portions of the patient's history were reviewed and updated as appropriate: allergies, current medications, past family history, past medical history, past social history, past surgical history and problem list.   Objective:  There were no vitals filed for this visit.  Fetal Status:     Movement: Present     General:  Alert, oriented and cooperative. Patient is in no acute distress.  Respiratory: Normal respiratory effort, no problems with respiration noted  Mental Status: Normal mood and affect. Normal behavior. Normal judgment and thought content.  Rest of physical exam deferred due to type of encounter  Imaging: No results found.  Assessment and Plan:  Pregnancy: G3P2002 at [redacted]w[redacted]d 1. Supervision of other normal pregnancy, antepartum + FM  no problems  2. Antepartum multigravida of advanced maternal age  34. History of postpartum hypertension BP last checked last appt. Today during visit 117/83 P 85  4. Low-lying placenta resolved  Preterm labor symptoms and general obstetric precautions including but not limited to vaginal bleeding, contractions, leaking of fluid and fetal movement were reviewed in detail with the patient. I discussed the assessment and treatment plan with the patient. The patient was provided an opportunity to ask questions and all were answered. The patient agreed with the plan and demonstrated an understanding of the instructions. The patient was advised to call back or seek an in-person office evaluation/go to MAU at [facility] for any urgent or concerning symptoms. Please refer to After Visit Summary for other counseling recommendations.   I provided 12 minutes of face-to-face time during this encounter.  No follow-ups on file.  No future appointments.  Lavonia Drafts, MD [facility] for [employer], [place]

## 2019-07-23 ENCOUNTER — Encounter (HOSPITAL_COMMUNITY): Payer: Self-pay

## 2019-07-23 ENCOUNTER — Telehealth: Payer: Self-pay

## 2019-07-23 ENCOUNTER — Encounter: Payer: Medicaid Other | Admitting: Obstetrics

## 2019-07-23 ENCOUNTER — Inpatient Hospital Stay (HOSPITAL_COMMUNITY)
Admission: AD | Admit: 2019-07-23 | Discharge: 2019-07-24 | DRG: 807 | Disposition: A | Discharge status: Home / Self Care | Payer: Medicaid Other | Attending: Obstetrics and Gynecology | Admitting: Obstetrics and Gynecology

## 2019-07-23 DIAGNOSIS — Z20828 Contact with and (suspected) exposure to other viral communicable diseases: Secondary | ICD-10-CM | POA: Diagnosis present

## 2019-07-23 DIAGNOSIS — Z30017 Encounter for initial prescription of implantable subdermal contraceptive: Secondary | ICD-10-CM | POA: Diagnosis not present

## 2019-07-23 DIAGNOSIS — Z3A39 39 weeks gestation of pregnancy: Secondary | ICD-10-CM | POA: Diagnosis not present

## 2019-07-23 DIAGNOSIS — O26893 Other specified pregnancy related conditions, third trimester: Secondary | ICD-10-CM | POA: Diagnosis present

## 2019-07-23 LAB — CBC
HCT: 39.2 % (ref 36.0–46.0)
Hemoglobin: 13.3 g/dL (ref 12.0–15.0)
MCH: 29.9 pg (ref 26.0–34.0)
MCHC: 33.9 g/dL (ref 30.0–36.0)
MCV: 88.1 fL (ref 80.0–100.0)
Platelets: 273 10*3/uL (ref 150–400)
RBC: 4.45 MIL/uL (ref 3.87–5.11)
RDW: 14.7 % (ref 11.5–15.5)
WBC: 14.5 10*3/uL — ABNORMAL HIGH (ref 4.0–10.5)
nRBC: 0 % (ref 0.0–0.2)

## 2019-07-23 LAB — TYPE AND SCREEN
ABO/RH(D): O POS
Antibody Screen: NEGATIVE

## 2019-07-23 LAB — SARS CORONAVIRUS 2 BY RT PCR (HOSPITAL ORDER, PERFORMED IN ~~LOC~~ HOSPITAL LAB): SARS Coronavirus 2: NEGATIVE

## 2019-07-23 MED ORDER — FENTANYL CITRATE (PF) 100 MCG/2ML IJ SOLN
50.0000 ug | INTRAMUSCULAR | Status: DC | PRN
  Filled 2019-07-23: qty 2

## 2019-07-23 MED ORDER — BENZOCAINE-MENTHOL 20-0.5 % EX AERO
1.0000 "application " | INHALATION_SPRAY | CUTANEOUS | Status: DC | PRN
  Administered 2019-07-23: 1 via TOPICAL
  Filled 2019-07-23: qty 56

## 2019-07-23 MED ORDER — LIDOCAINE HCL (PF) 1 % IJ SOLN
30.0000 mL | INTRAMUSCULAR | Status: AC | PRN
  Administered 2019-07-23: 30 mL via SUBCUTANEOUS
  Filled 2019-07-23: qty 30

## 2019-07-23 MED ORDER — DIPHENHYDRAMINE HCL 25 MG PO CAPS: 25.0000 mg | ORAL_CAPSULE | Freq: Four times a day (QID) | ORAL | Status: DC | PRN

## 2019-07-23 MED ORDER — SIMETHICONE 80 MG PO CHEW: 80.0000 mg | CHEWABLE_TABLET | ORAL | Status: DC | PRN

## 2019-07-23 MED ORDER — IBUPROFEN 600 MG PO TABS
600.0000 mg | ORAL_TABLET | Freq: Four times a day (QID) | ORAL | Status: DC
  Administered 2019-07-23 – 2019-07-24 (×4): 600 mg via ORAL
  Filled 2019-07-23 (×4): qty 1

## 2019-07-23 MED ORDER — LACTATED RINGERS IV SOLN
INTRAVENOUS | Status: DC
  Administered 2019-07-23: 12:00:00 via INTRAVENOUS

## 2019-07-23 MED ORDER — WITCH HAZEL-GLYCERIN EX PADS: 1.0000 "application " | MEDICATED_PAD | CUTANEOUS | Status: DC | PRN

## 2019-07-23 MED ORDER — OXYTOCIN BOLUS FROM INFUSION
500.0000 mL | Freq: Once | INTRAVENOUS | Status: AC
  Administered 2019-07-23: 500 mL via INTRAVENOUS

## 2019-07-23 MED ORDER — COCONUT OIL OIL: 1.0000 "application " | TOPICAL_OIL | Status: DC | PRN

## 2019-07-23 MED ORDER — PRENATAL MULTIVITAMIN CH
1.0000 | ORAL_TABLET | Freq: Every day | ORAL | Status: DC
  Administered 2019-07-24: 1 via ORAL
  Filled 2019-07-23: qty 1

## 2019-07-23 MED ORDER — MEASLES, MUMPS & RUBELLA VAC IJ SOLR: 0.5000 mL | Freq: Once | INTRAMUSCULAR | Status: DC

## 2019-07-23 MED ORDER — FLEET ENEMA 7-19 GM/118ML RE ENEM: 1.0000 | ENEMA | RECTAL | Status: DC | PRN

## 2019-07-23 MED ORDER — ACETAMINOPHEN 325 MG PO TABS
650.0000 mg | ORAL_TABLET | ORAL | Status: DC | PRN
  Administered 2019-07-24: 650 mg via ORAL
  Filled 2019-07-23: qty 2

## 2019-07-23 MED ORDER — ONDANSETRON HCL 4 MG PO TABS: 4.0000 mg | ORAL_TABLET | ORAL | Status: DC | PRN

## 2019-07-23 MED ORDER — OXYTOCIN 40 UNITS IN NORMAL SALINE INFUSION - SIMPLE MED
2.5000 [IU]/h | INTRAVENOUS | Status: DC
  Administered 2019-07-23: 2.5 [IU]/h via INTRAVENOUS
  Filled 2019-07-23: qty 1000

## 2019-07-23 MED ORDER — LACTATED RINGERS IV SOLN: 500.0000 mL | INTRAVENOUS | Status: DC | PRN

## 2019-07-23 MED ORDER — TETANUS-DIPHTH-ACELL PERTUSSIS 5-2.5-18.5 LF-MCG/0.5 IM SUSP: 0.5000 mL | Freq: Once | INTRAMUSCULAR | Status: DC

## 2019-07-23 MED ORDER — SOD CITRATE-CITRIC ACID 500-334 MG/5ML PO SOLN: 30.0000 mL | ORAL | Status: DC | PRN

## 2019-07-23 MED ORDER — DIBUCAINE (PERIANAL) 1 % EX OINT: 1.0000 "application " | TOPICAL_OINTMENT | CUTANEOUS | Status: DC | PRN

## 2019-07-23 MED ORDER — ACETAMINOPHEN 325 MG PO TABS: 650.0000 mg | ORAL_TABLET | ORAL | Status: DC | PRN

## 2019-07-23 MED ORDER — ONDANSETRON HCL 4 MG/2ML IJ SOLN: 4.0000 mg | INTRAMUSCULAR | Status: DC | PRN

## 2019-07-23 MED ORDER — SENNOSIDES-DOCUSATE SODIUM 8.6-50 MG PO TABS
2.0000 | ORAL_TABLET | ORAL | Status: DC
  Administered 2019-07-23: 2 via ORAL
  Filled 2019-07-23: qty 2

## 2019-07-23 MED ORDER — OXYCODONE-ACETAMINOPHEN 5-325 MG PO TABS: 2.0000 | ORAL_TABLET | ORAL | Status: DC | PRN

## 2019-07-23 MED ORDER — ONDANSETRON HCL 4 MG/2ML IJ SOLN: 4.0000 mg | Freq: Four times a day (QID) | INTRAMUSCULAR | Status: DC | PRN

## 2019-07-23 MED ORDER — OXYCODONE-ACETAMINOPHEN 5-325 MG PO TABS: 1.0000 | ORAL_TABLET | ORAL | Status: DC | PRN

## 2019-07-23 NOTE — Discharge Summary (Addendum)
Postpartum Discharge Summary     Patient Name: Tina Byrd DOB: 03/29/1986 MRN: [id number]  Date of admission: 07/23/2019 Delivering Provider: Julianne Handler   Date of discharge: 07/24/2019  Admitting diagnosis: 39wks CTX BLEEDING 5 min Intrauterine pregnancy: [redacted]w[redacted]d    Secondary diagnosis:  Active Problems:   Indication for care in labor or delivery   SVD (spontaneous vaginal delivery)  Additional problems: AMA, low-lying placenta-resolved, hx of pp HTN in G2     Discharge diagnosis: Term Pregnancy Delivered                                                                                                Post partum procedures:None  Augmentation: AROM  Complications: None  Hospital course:  Onset of Labor With Vaginal Delivery     33y.o. yo G3P2002 at 323w2das admitted in Active Labor on 07/23/2019. Patient had an uncomplicated labor course as follows:  Membrane Rupture Time/Date: 12:27 PM ,07/23/2019   Intrapartum Procedures: Episiotomy: None [1]                                         Lacerations:  2nd degree [3]  Patient had a delivery of a Viable infant. 07/23/2019  Information for the patient's newborn:  NeHarlei, Lehrmann0[[id number]]Delivery Method: Vaginal, Spontaneous(Filed from Delivery Summary)     Pateint had an uncomplicated postpartum course.  She is ambulating, tolerating a regular diet, passing flatus, and urinating well. Patient is discharged home in stable condition on 07/24/19. Bps have been normal in hospital; no evidence of PP HTN.   Magnesium Sulfate recieved: No BMZ received: No  Physical exam  Vitals:   07/23/19 2001 07/23/19 2356 07/24/19 0408 07/24/19 0628  BP: 111/72 128/81 110/72 106/73  Pulse: 83 93 70 62  Resp: 16 16 16 16   Temp: 98.2 F (36.8 C) 98.3 F (36.8 C) 98.1 F (36.7 C) 97.9 F (36.6 C)  TempSrc: Oral Oral Oral Oral  SpO2:  100%    Weight:      Height:       General: alert, cooperative and no distress Lochia:  appropriate Uterine Fundus: firm Incision: N/A DVT Evaluation: No evidence of DVT seen on physical exam. Labs: Lab Results  Component Value Date   WBC 14.5 (H) 07/23/2019   HGB 13.3 07/23/2019   HCT 39.2 07/23/2019   MCV 88.1 07/23/2019   PLT 273 07/23/2019   CMP Latest Ref Rng & Units 01/03/2019  Glucose 65 - 99 mg/dL 68  BUN 6 - 20 mg/dL 6  Creatinine 0.57 - 1.00 mg/dL 0.59  Sodium 134 - 144 mmol/L 139  Potassium 3.5 - 5.2 mmol/L 5.3(H)  Chloride 96 - 106 mmol/L 103  CO2 20 - 29 mmol/L 22  Calcium 8.7 - 10.2 mg/dL 9.7  Total Protein 6.0 - 8.5 g/dL 7.1  Total Bilirubin 0.0 - 1.2 mg/dL 0.5  Alkaline Phos 39 - 117 IU/L 74  AST 0 - 40 IU/L 12  ALT 0 -  32 IU/L 17    Discharge instruction: per After Visit Summary and "Baby and Me Booklet".  After visit meds:  Allergies as of 07/24/2019   No Known Allergies     Medication List    STOP taking these medications   aspirin EC 81 MG tablet   Doxylamine-Pyridoxine ER 20-20 MG Tbcr Commonly known as: Bonjesta   nystatin cream Commonly known as: MYCOSTATIN     TAKE these medications   acetaminophen 325 MG tablet Commonly known as: Tylenol Take 2 tablets (650 mg total) by mouth every 4 (four) hours as needed (for pain scale < 4).   Blood Pressure Monitoring Kit Take BP Weekly   ibuprofen 600 MG tablet Commonly known as: ADVIL Take 1 tablet (600 mg total) by mouth every 6 (six) hours.   PrePLUS 27-1 MG Tabs Take 1 tablet by mouth daily.   witch hazel-glycerin pad Commonly known as: TUCKS Apply 1 application topically as needed for hemorrhoids.       Diet: routine diet  Activity: Advance as tolerated. Pelvic rest for 6 weeks.   Outpatient follow up:6 weeks Follow up Appt: Future Appointments  Date Time Provider [place]  09/03/2019  9:15 AM Leftwich-Kirby, Kathie Dike, CNM [facility] None   Follow up Visit:   Please schedule this patient for Postpartum visit in: 6 weeks with the following provider: Any  provider For C/S patients schedule nurse incision check in weeks 2 weeks: no Low risk pregnancy complicated by:  Delivery mode:  SVD Anticipated Birth Control:  Nexplanon- in hospital PP Procedures needed: Schedule Integrated Junior visit: no   Please see progress note on Nexplanon placement.    Newborn Data: Live born female  Birth Weight: 6 lb 14.9 oz (3145 g) APGAR: 9, 9  Newborn Delivery   Birth date/time: 07/23/2019 13:06:00 Delivery type: Vaginal, Spontaneous      Baby Feeding: both Disposition:home with mother   07/24/2019 Starr Lake, CNM

## 2019-07-23 NOTE — MAU Note (Signed)
Pt c/o contractions since 0330 and period like vaginal bleeding since 0530. Had prenatal visit scheduled but did not go.

## 2019-07-23 NOTE — H&P (Signed)
LABOR AND DELIVERY ADMISSION HISTORY AND PHYSICAL NOTE  Tina Byrd is a 33 y.o. female G64P2002 with IUP at 31w2dby 16 week U/S presenting for SOL.  She reports positive fetal movement. She denies leakage of fluid or vaginal bleeding.  [place]:  - AMA - Hx of low lying placenta 02/14/19;  resolved (04/06/19) - hx of ppHTN w/ previous pregnancy  Past Medical History: History reviewed. No pertinent past medical history.  Past Surgical History: History reviewed. No pertinent surgical history.  Obstetrical History: OB History    Gravida  3   Para  2   Term  2   Preterm      AB      Living  2     SAB      TAB      Ectopic      Multiple  0   Live Births  2           Social History: Social History   Socioeconomic History  . Marital status: Married    Spouse name: Tina Byrd . Number of children: 1  . Years of education: 132 . Highest education level: Not on file  Occupational History  . Occupation: Housewife  Social Needs  . Financial resource strain: Not hard at all  . Food insecurity    Worry: Never true    Inability: Never true  . Transportation needs    Medical: No    Non-medical: No  Tobacco Use  . Smoking status: Never Smoker  . Smokeless tobacco: Never Used  Substance and Sexual Activity  . Alcohol use: No    Alcohol/week: 0.0 standard drinks  . Drug use: No  . Sexual activity: Yes    Birth control/protection: None  Lifestyle  . Physical activity    Days per week: 7 days    Minutes per session: 150+ min  . Stress: Only a little  Relationships  . Social connections    Talks on phone: More than three times a week    Gets together: More than three times a week    Attends religious service: More than 4 times per year    Active member of club or organization: No    Attends meetings of clubs or organizations: Never    Relationship status: Not on file  Other Topics Concern  .  Not on file  Social History Narrative   [place].   Her husband [place] 2 sons behind [place] 89[place] 13  He had them with a girlfriend in past.   They plan to bring them to [place] When they have the financial ability to do so.   Also, 2 yo daughter, RWells [place]   Lives at home with husband and daughter.    Family History: Family History  Problem Relation Age of Onset  . Diabetes Mother   . Lung disease Mother        "the blood vessel to lung is too small  . Diabetes Father   . Hypertension Father     Allergies: No Known Allergies  Medications Prior to Admission  Medication Sig Dispense Refill Last Dose  . aspirin EC 81 MG tablet Take 1 tablet (81 mg total) by mouth daily. Take after 12 weeks for prevention of preeclampsia later in pregnancy 300 tablet 2 07/22/2019 at Unknown time  . Prenatal Vit-Fe Fumarate-FA (PREPLUS)  27-1 MG TABS Take 1 tablet by mouth daily. 30 tablet 13 07/22/2019 at Unknown time  . Blood Pressure Monitoring KIT Take BP Weekly (Patient not taking: Reported on 07/09/2019) 1 kit 0   . Doxylamine-Pyridoxine ER (BONJESTA) 20-20 MG TBCR Take 1 tablet by mouth at bedtime. Add morning dose if needed (Patient not taking: Reported on 02/21/2019) 45 tablet 1   . nystatin cream (MYCOSTATIN) Apply 1 application topically 2 (two) times daily. (Patient not taking: Reported on 07/09/2019) 30 g 1      Review of Systems  All systems reviewed and negative except as stated in HPI  Physical Exam Blood pressure 118/62, pulse 80, temperature 98.1 F (36.7 C), temperature source Oral, resp. rate 18, height 5' (1.524 m), weight 65.7 kg, last menstrual period 10/06/2018, SpO2 99 %, unknown if currently breastfeeding. General appearance: alert, oriented, NAD Lungs: normal respiratory effort Heart: regular rate Abdomen: soft, non-tender; gravid, FH appropriate for GA Extremities: No calf swelling or  tenderness Presentation: cephalic EFM: 627 bpm, mod variability, + accels, no decels Toco: q4  Dilation: 9 Effacement (%): 100 Station: -1 Exam by:: M Julieanna Geraci CNM  Prenatal labs: ABO, Rh: O/Positive/-- (01/22 1028) Antibody: Negative (01/22 1028) Rubella: 4.06 (01/22 1028) RPR: Non Reactive (05/21 0918)  HBsAg: Negative (01/22 1028)  HIV: Non Reactive (05/21 0918)  GC/Chlamydia: Negative (07/20 0000) GBS:  Negative  2-hr GTT: 100 (05/21 0918) Genetic screening: WNL Anatomy US: Normal detailed fetal anatomy  Prenatal Transfer Tool  Maternal Diabetes: No Genetic Screening: Normal Maternal Ultrasounds/Referrals: Normal Fetal Ultrasounds or other Referrals:  None Maternal Substance Abuse:  No Significant Maternal Medications:  None Significant Maternal Lab Results: Group B Strep negative  No results found for this or any previous visit (from the past 24 hour(s)).  Patient Active Problem List   Diagnosis Date Noted  . Low-lying placenta 02/14/2019  . History of postpartum hypertension 01/03/2019  . AMA (advanced maternal age) multigravida 35+ 01/03/2019  . Encounter for supervision of normal pregnancy, antepartum 01/03/2019    Assessment: Tina Byrd is a 33 y.o. G3P2002 at 93w2dhere for SOL.  #Labor: Manage expectantly. Consider augmentation if necessary. Continue time appropriate cervical exams. #Pain: Patient does not desire epidural; may have 50-1042m IV fentanyl q1h as needed. #FWB:  Cat I,  Vertex #ID: GBS negative #MOF: Breast #MOC: [place]elineDO Family Medicine PGY-1 07/23/2019, 11:42 AM  Midwife attestation: I have seen and examined this patient; I agree with above documentation in the resident's note.   PE: Gen: calm comfortable, NAD Resp: normal effort and rate Abd: gravid  ROS, labs, PMH reviewed  Assessment/Plan: Tina Continos a [age].o. [id number] for active labor Admit to LD Labor: active FWB: Cat I GBS  neg Anticipate rapid progression and SVD  MeJulianne HandlerCNM  07/23/2019, 11:42 AM

## 2019-07-23 NOTE — Telephone Encounter (Signed)
Pt called triage line stating that she has been contracting and cramping about every 10 minutes since 3 am, and then started bleeding a small amount at about 0530. Patient advised to go to [facility]U to be evaluated for labor.

## 2019-07-24 ENCOUNTER — Encounter (HOSPITAL_COMMUNITY): Payer: Self-pay | Admitting: *Deleted

## 2019-07-24 ENCOUNTER — Other Ambulatory Visit: Payer: Self-pay

## 2019-07-24 ENCOUNTER — Ambulatory Visit: Payer: Self-pay

## 2019-07-24 DIAGNOSIS — Z30017 Encounter for initial prescription of implantable subdermal contraceptive: Secondary | ICD-10-CM

## 2019-07-24 LAB — ABO/RH: ABO/RH(D): O POS

## 2019-07-24 LAB — RPR: RPR Ser Ql: NONREACTIVE

## 2019-07-24 MED ORDER — ACETAMINOPHEN 325 MG PO TABS: 650.0000 mg | ORAL_TABLET | ORAL | 1 refills | Status: DC | PRN

## 2019-07-24 MED ORDER — LIDOCAINE HCL 1 % IJ SOLN
0.0000 mL | Freq: Once | INTRAMUSCULAR | Status: DC | PRN
  Filled 2019-07-24: qty 20

## 2019-07-24 MED ORDER — ETONOGESTREL 68 MG ~~LOC~~ IMPL
68.0000 mg | DRUG_IMPLANT | Freq: Once | SUBCUTANEOUS | Status: AC
  Administered 2019-07-24: 68 mg via SUBCUTANEOUS
  Filled 2019-07-24: qty 1

## 2019-07-24 MED ORDER — IBUPROFEN 600 MG PO TABS: 600.0000 mg | ORAL_TABLET | Freq: Four times a day (QID) | ORAL | 0 refills | Status: DC

## 2019-07-24 MED ORDER — WITCH HAZEL-GLYCERIN EX PADS: 1.0000 "application " | MEDICATED_PAD | CUTANEOUS | 12 refills | Status: DC | PRN

## 2019-07-24 NOTE — Lactation Note (Addendum)
This note was copied from a baby's chart. Lactation Consultation Note  Patient Name: Tina Byrd [id number] Date: 07/24/2019 Reason for consult: Hyperbilirubinemia;Follow-up assessment P3, 32 hour female infant. Per mom, breastfeeding is going well. Infant is being watched for jaundice levels. LC discussed mom hand expressing to give back extra volume after latching infant to breast if she desires. Mom taught back hand expression and infant was given 7 ml of EBM by spoon. Mom latched infant on left breast using cradle hold without assistance from [facility]. Mom knows to breastfeed according to hunger cues, 8 to 12 times within 24 hours and on demand.  Maternal Data    Feeding Feeding Type: Breast Fed  LATCH Score Latch: Grasps breast easily, tongue down, lips flanged, rhythmical sucking.  Audible Swallowing: Spontaneous and intermittent  Type of Nipple: Everted at rest and after stimulation  Comfort (Breast/Nipple): Soft / non-tender  Hold (Positioning): No assistance needed to correctly position infant at breast.  LATCH Score: 10  Interventions Interventions: Hand express  Lactation Tools Discussed/Used     Consult Status Consult Status: Follow-up Date: 07/25/19 Follow-up type: In-patient    Vicente Serene 07/24/2019, 9:29 PM

## 2019-07-24 NOTE — Lactation Note (Signed)
This note was copied from a baby's chart. Lactation Consultation Note  Patient Name: Tina Byrd [id number] Date: 07/24/2019 Reason for consult: Initial assessment P3, 12 hour female infant. Mom feels breastfeeding is going well. Mom finished breastfeeding prior to [facility] entering the room per mom, infant breastfeed for 62 minutes and mom feels infant is latching well. LC did not observe a latch at this time. Infant had one void and one stool since birth. Mom is active on the [facility] program in [place]. Mom is experienced at breastfeeding she breast feed both of her older children for 2 years each. Mom doesn't want a hand pump.  Mom knows to breastfeed infant according hunger cues, 8 to 12 times within 24 hours and on demand. Mom knows to call Nurse or [facility] if she has any questions, concerns or need assistance with latching infant to breast.  Reviewed Baby & Me book's Breastfeeding Basics.  Mom made aware of O/P services, breastfeeding support groups, community resources, and our phone # for post-discharge questions.  Maternal Data Formula Feeding for Exclusion: Yes Reason for exclusion: Mother's choice to formula and breast feed on admission Has patient been taught Hand Expression?: Yes Does the patient have breastfeeding experience prior to this delivery?: Yes  Feeding Feeding Type: Breast Fed  LATCH Score Latch: Grasps breast easily, tongue down, lips flanged, rhythmical sucking.  Audible Swallowing: A few with stimulation  Type of Nipple: Everted at rest and after stimulation  Comfort (Breast/Nipple): Soft / non-tender  Hold (Positioning): Assistance needed to correctly position infant at breast and maintain latch.  LATCH Score: 8  Interventions Interventions: Breast feeding basics reviewed;Position options  Lactation Tools Discussed/Used WIC Program: Yes   Consult Status Consult Status: Follow-up Date: 07/24/19 Follow-up type: In-patient    Vicente Serene 07/24/2019, 1:55 AM

## 2019-07-24 NOTE — Progress Notes (Addendum)
Post Partum Day 1  Subjective: Pt is doing well this morning. Pt reporting only a small amount of cramping with breastfeeding, relieved with motrin. up ad lib, voiding, tolerating PO and + flatus. No other complaints.  Objective: Blood pressure 106/73, pulse 62, temperature 97.9 F (36.6 C), temperature source Oral, resp. rate 16, height 5' (1.524 m), weight 65.7 kg, last menstrual period 10/06/2018, SpO2 100 %, unknown if currently breastfeeding.  Physical Exam:  General: alert, cooperative and no distress Lochia: appropriate Uterine Fundus: firm Incision: n/a DVT Evaluation: No evidence of DVT seen on physical exam. Negative Homan's sign. No cords or calf tenderness. No significant calf/ankle edema.  Recent Labs    07/23/19 1144  HGB 13.3  HCT 39.2    Assessment/Plan: Discharge home, Breastfeeding and Contraception inpatient nexplanon   Discussed Nexplanon along with benefits and risks, pt wants to proceed with inpatient placement.  Pt is wanting to go home today if Peds clears baby.  Post-Placental Nexplanon Insertion Procedure Note  Patient was identified. Informed consent was signed, signed copy in chart. A time-out was performed.    The insertion site was identified 8-10 cm (3-4 inches) from the medial epicondyle of the humerus and 3-5 cm (1.25-2 inches) posterior to (below) the sulcus (groove) between the biceps and triceps muscles of the patient's left arm and marked. The site was prepped and draped in the usual sterile fashion. Pt was prepped with alcohol swab and then injected with 10 cc of 1% lidocaine. The site was prepped with betadine. Nexplanon removed form packaging,  Device confirmed in needle, then inserted full length of needle and withdrawn per handbook instructions. Provider and patient verified presence of the implant in the woman's arm by palpation. Pt insertion site was covered with steristrips/adhesive bandage and pressure bandage. There was minimal blood  loss. Patient tolerated procedure well.  Patient was given post procedure instructions and Nexplanon user card with expiration date. Condoms were recommended for STI prevention. Patient was asked to keep the pressure dressing on for 24 hours to minimize bruising and keep the adhesive bandage on for 3-5 days. The patient verbalized understanding of the plan of care and agrees.   Lot # [id number] Expiration Date November/12/2020   LOS: 1 day   Renee Harder, SNM 07/24/2019, 9:24 AM

## 2019-07-25 ENCOUNTER — Ambulatory Visit: Payer: Self-pay

## 2019-07-25 NOTE — Lactation Note (Addendum)
This note was copied from a baby's chart. Lactation Consultation Note:  Infant is 46 hours old.  Arrived in Mothers room and infant was breastfeeding in cradle hold. Mother sitting without support to her back and leaning over infant. Assist mother with good pillow support to her back and under the infant. Mother reports that this feels much better , reports that her back was hurting.  Observed that infant has a shallow latch. Assist mother with placing infant on with wide open mouth. Observed that mother has a pinch on the side of her nipple. She was advised in tips for better latch.  Observed that mother has a large milk flow with hand expressed.   Discussed treatment and prevention of engorgement.  Mother advised to continue to cue base feed infant and to feed at least 8-12 or more times in 24 hours. Discussed cluster feeding.  Mother was given a harmony hand pump . She began immediately pumping and plans to spoon feed infant after breastfeeding.  Mother to follow [facility] with [facility] services as needed.   Patient Name: Tina Byrd [id number] Date: 07/25/2019 Reason for consult: Follow-up assessment   Maternal Data    Feeding Feeding Type: Breast Fed  LATCH Score Latch: Grasps breast easily, tongue down, lips flanged, rhythmical sucking.  Audible Swallowing: Spontaneous and intermittent  Type of Nipple: Everted at rest and after stimulation  Comfort (Breast/Nipple): Filling, red/small blisters or bruises, mild/mod discomfort  Hold (Positioning): Assistance needed to correctly position infant at breast and maintain latch.  LATCH Score: 8  Interventions Interventions: Assisted with latch;Breast compression;Adjust position;Support pillows;Position options;Comfort gels;Hand pump  Lactation Tools Discussed/Used     Consult Status Consult Status: Follow-up Date: 07/26/19 Follow-up type: In-patient    Jess Barters [facility] 07/25/2019, 10:10 AM

## 2019-09-03 ENCOUNTER — Other Ambulatory Visit: Payer: Self-pay

## 2019-09-03 ENCOUNTER — Encounter: Payer: Self-pay | Admitting: Advanced Practice Midwife

## 2019-09-03 ENCOUNTER — Ambulatory Visit (INDEPENDENT_AMBULATORY_CARE_PROVIDER_SITE_OTHER): Payer: Medicaid Other | Admitting: Advanced Practice Midwife

## 2019-09-03 DIAGNOSIS — Z975 Presence of (intrauterine) contraceptive device: Secondary | ICD-10-CM | POA: Insufficient documentation

## 2019-09-03 DIAGNOSIS — Z1389 Encounter for screening for other disorder: Secondary | ICD-10-CM

## 2019-09-03 NOTE — Progress Notes (Signed)
Post Partum Exam  Tina Byrd is a 33 y.o. G52P2002 female who presents for a postpartum visit. She is five weeks postpartum following vaginal delivery 07/23/2019. I have fully reviewed the prenatal and intrapartum course. The delivery was at 39.2 gestational weeks.  Anesthesia:local. Postpartum course has been uncomplicated. Baby's course has been uncomplicated. Baby is feeding by breast/bottle. Bleeding vaginal. Bowel function is vaginal. Bladder function is vaginal. Patient is  sexually active. Contraception method is nexplanon. Postpartum depression screening:negative  The following portions of the patient's history were reviewed and updated as appropriate: allergies, current medications, past family history, past medical history, past social history, past surgical history and problem list. Last pap smear done 01/03/2019 and was normal  Review of Systems Pertinent items noted in HPI and remainder of comprehensive ROS otherwise negative.    Objective:  Blood pressure 109/78, pulse 80, weight 134 lb 8 oz (61 kg), unknown if currently breastfeeding.   VS reviewed, nursing note reviewed,  Constitutional: well developed, well nourished, no distress HEENT: normocephalic CV: normal rate Pulm/chest wall: normal effort Abdomen: soft Neuro: alert and oriented x 3 Skin: warm, dry Psych: affect normal      Assessment/Plan:   1. Postpartum care following vaginal delivery --Doing well, good support at home --Nexplanon in place, no problems.  Her friend had 1 month of bleeding with Nexplanon. Discussed side effect of irregular bleeding with Nexplanon, treatments available if bleeding is too often/too heavy. Questions answered. --Pt had intercourse this week with husband and had some burning, not pain but slight burning.  This is likely normal, pt had sutures so tissue is still healing. Contact office if this persists more than a few weeks.    Social/emotional concerns:  None.  Contraception:  Nexplanon  Follow up in: 1 year or as needed.   Fatima Blank, CNM 9:52 AM

## 2019-09-03 NOTE — Patient Instructions (Signed)
Etonogestrel implant What is this medicine? ETONOGESTREL (et oh noe JES trel) is a contraceptive (birth control) device. It is used to prevent pregnancy. It can be used for up to 3 years. This medicine may be used for other purposes; ask your health care provider or pharmacist if you have questions. COMMON BRAND NAME(S): Implanon, Nexplanon What should I tell my health care provider before I take this medicine? They need to know if you have any of these conditions:  abnormal vaginal bleeding  blood vessel disease or blood clots  breast, cervical, endometrial, ovarian, liver, or uterine cancer  diabetes  gallbladder disease  heart disease or recent heart attack  high blood pressure  high cholesterol or triglycerides  kidney disease  liver disease  migraine headaches  seizures  stroke  tobacco smoker  an unusual or allergic reaction to etonogestrel, anesthetics or antiseptics, other medicines, foods, dyes, or preservatives  pregnant or trying to get pregnant  breast-feeding How should I use this medicine? This device is inserted just under the skin on the inner side of your upper arm by a health care professional. Talk to your pediatrician regarding the use of this medicine in children. Special care may be needed. Overdosage: If you think you have taken too much of this medicine contact a poison control center or emergency room at once. NOTE: This medicine is only for you. Do not share this medicine with others. What if I miss a dose? This does not apply. What may interact with this medicine? Do not take this medicine with any of the following medications:  amprenavir  fosamprenavir This medicine may also interact with the following medications:  acitretin  aprepitant  armodafinil  bexarotene  bosentan  carbamazepine  certain medicines for fungal infections like fluconazole, ketoconazole, itraconazole and voriconazole  certain medicines to treat  hepatitis, HIV or AIDS  cyclosporine  felbamate  griseofulvin  lamotrigine  modafinil  oxcarbazepine  phenobarbital  phenytoin  primidone  rifabutin  rifampin  rifapentine  St. John's wort  topiramate This list may not describe all possible interactions. Give your health care provider a list of all the medicines, herbs, non-prescription drugs, or dietary supplements you use. Also tell them if you smoke, drink alcohol, or use illegal drugs. Some items may interact with your medicine. What should I watch for while using this medicine? This product does not protect you against HIV infection (AIDS) or other sexually transmitted diseases. You should be able to feel the implant by pressing your fingertips over the skin where it was inserted. Contact your doctor if you cannot feel the implant, and use a non-hormonal birth control method (such as condoms) until your doctor confirms that the implant is in place. Contact your doctor if you think that the implant may have broken or become bent while in your arm. You will receive a user card from your health care provider after the implant is inserted. The card is a record of the location of the implant in your upper arm and when it should be removed. Keep this card with your health records. What side effects may I notice from receiving this medicine? Side effects that you should report to your doctor or health care professional as soon as possible:  allergic reactions like skin rash, itching or hives, swelling of the face, lips, or tongue  breast lumps, breast tissue changes, or discharge  breathing problems  changes in emotions or moods  if you feel that the implant may have broken or   bent while in your arm  high blood pressure  pain, irritation, swelling, or bruising at the insertion site  scar at site of insertion  signs of infection at the insertion site such as fever, and skin redness, pain or discharge  signs and  symptoms of a blood clot such as breathing problems; changes in vision; chest pain; severe, sudden headache; pain, swelling, warmth in the leg; trouble speaking; sudden numbness or weakness of the face, arm or leg  signs and symptoms of liver injury like dark yellow or brown urine; general ill feeling or flu-like symptoms; light-colored stools; loss of appetite; nausea; right upper belly pain; unusually weak or tired; yellowing of the eyes or skin  unusual vaginal bleeding, discharge Side effects that usually do not require medical attention (report to your doctor or health care professional if they continue or are bothersome):  acne  breast pain or tenderness  headache  irregular menstrual bleeding  nausea This list may not describe all possible side effects. Call your doctor for medical advice about side effects. You may report side effects to FDA at 1-800-FDA-1088. Where should I keep my medicine? This drug is given in a hospital or clinic and will not be stored at home. NOTE: This sheet is a summary. It may not cover all possible information. If you have questions about this medicine, talk to your doctor, pharmacist, or health care provider.  2020 Elsevier/Gold Standard (2017-10-18 14:11:42)  

## 2019-10-08 ENCOUNTER — Other Ambulatory Visit: Payer: Self-pay

## 2019-10-08 DIAGNOSIS — Z20822 Contact with and (suspected) exposure to covid-19: Secondary | ICD-10-CM

## 2019-10-10 LAB — NOVEL CORONAVIRUS, NAA: SARS-CoV-2, NAA: DETECTED — AB

## 2019-10-18 ENCOUNTER — Other Ambulatory Visit: Payer: Self-pay

## 2019-10-18 DIAGNOSIS — Z20822 Contact with and (suspected) exposure to covid-19: Secondary | ICD-10-CM

## 2019-10-20 LAB — NOVEL CORONAVIRUS, NAA: SARS-CoV-2, NAA: NOT DETECTED

## 2020-01-16 DIAGNOSIS — H1013 Acute atopic conjunctivitis, bilateral: Secondary | ICD-10-CM | POA: Diagnosis not present

## 2020-03-30 IMAGING — US US MFM OB FOLLOW UP
1 series · 13 of 28 positions shown · non-contrast
Comparison: none

[Series 1: us mfm ob follow up · 13 of 77 slices shown]
[im 3/77]
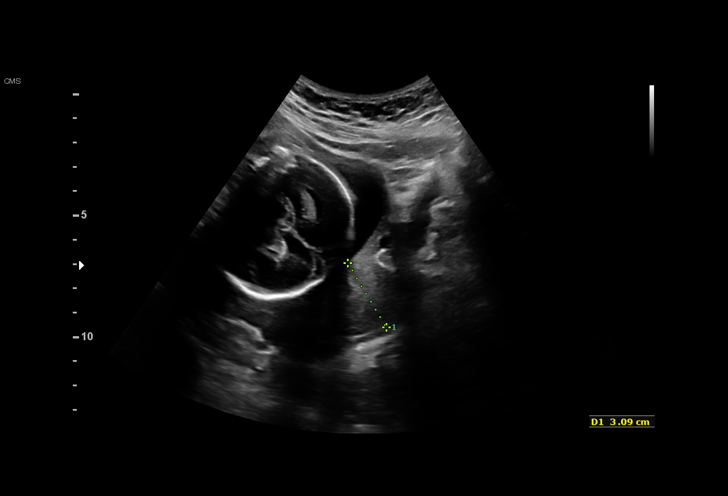
[im 9/77]
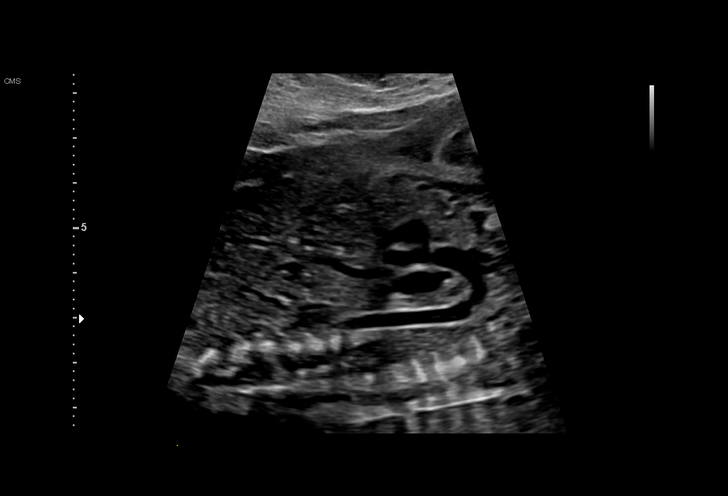
[im 15/77]
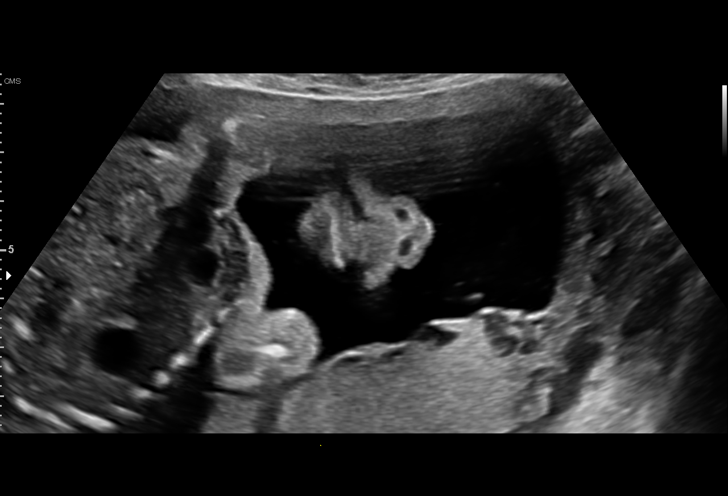
[im 20/77]
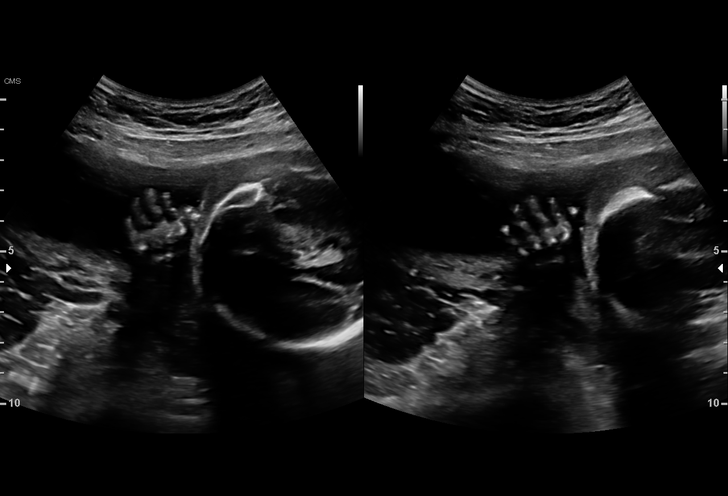
[im 26/77]
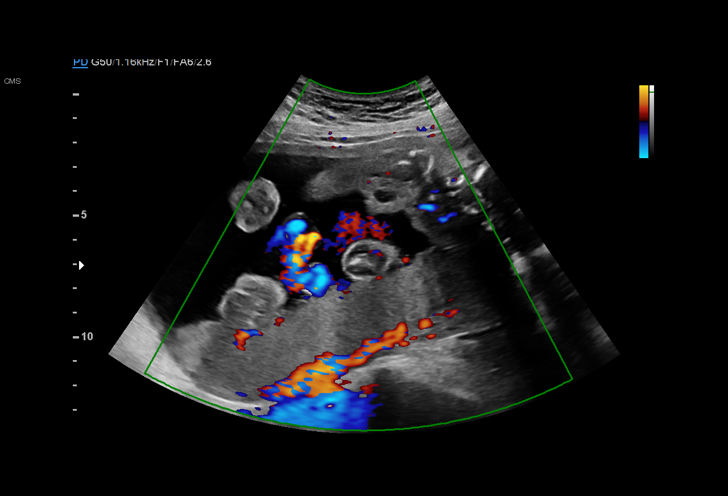
[im 31/77]
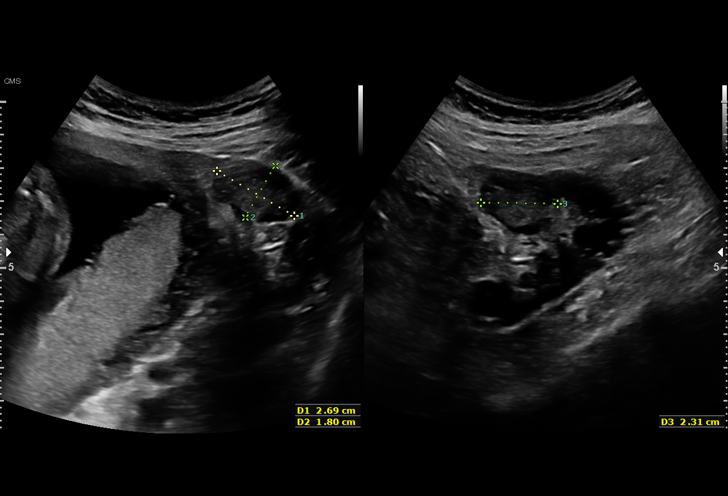
[im 40/77]
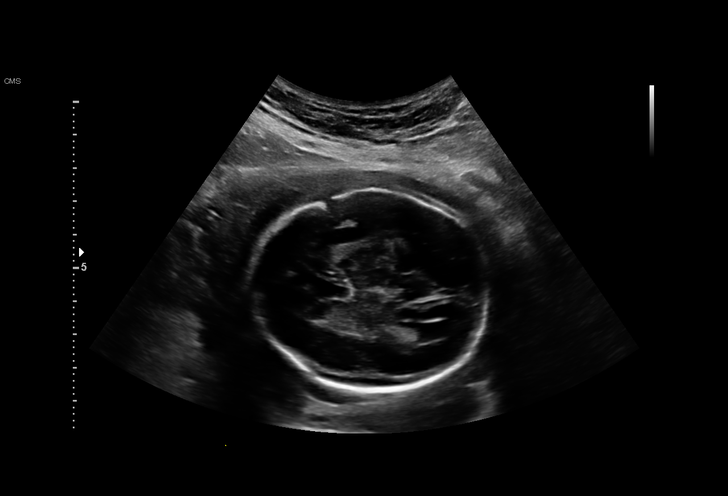
[im 46/77]
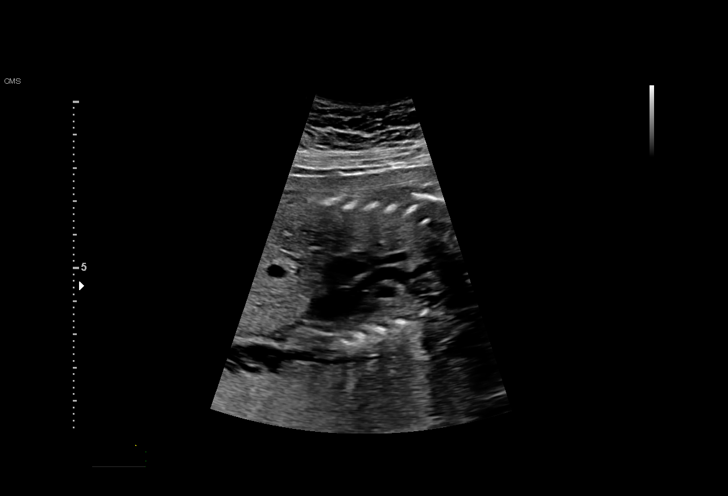
[im 51/77]
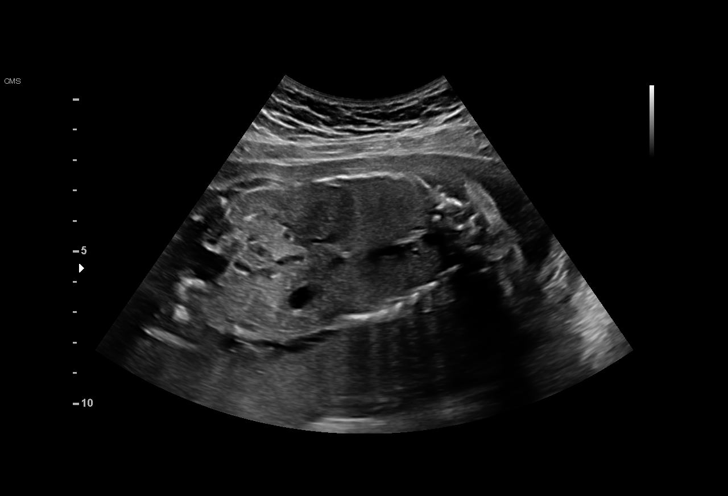
[im 57/77]
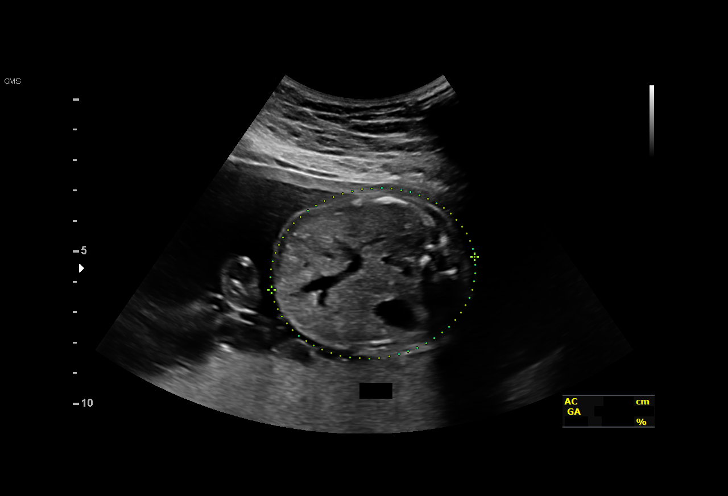
[im 62/77]
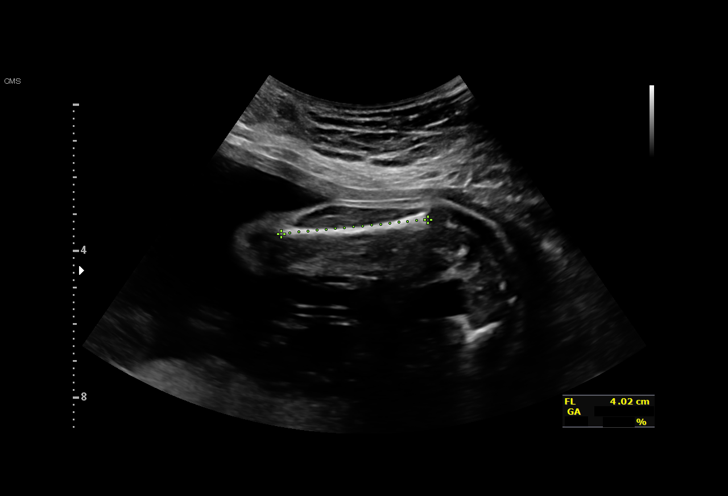
[im 68/77]
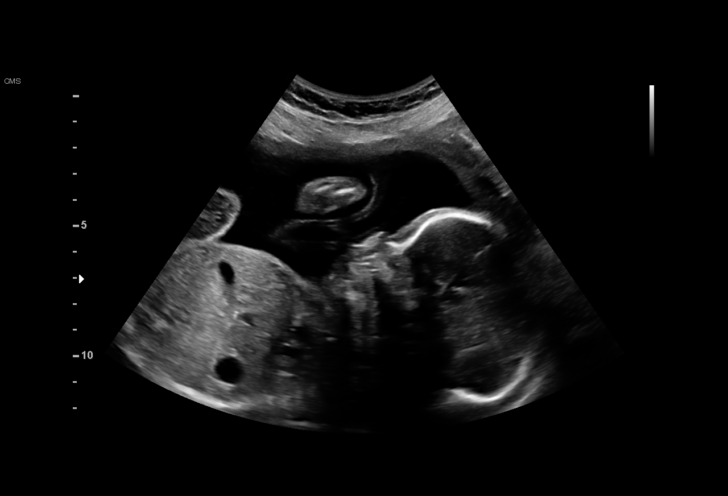
[im 74/77]
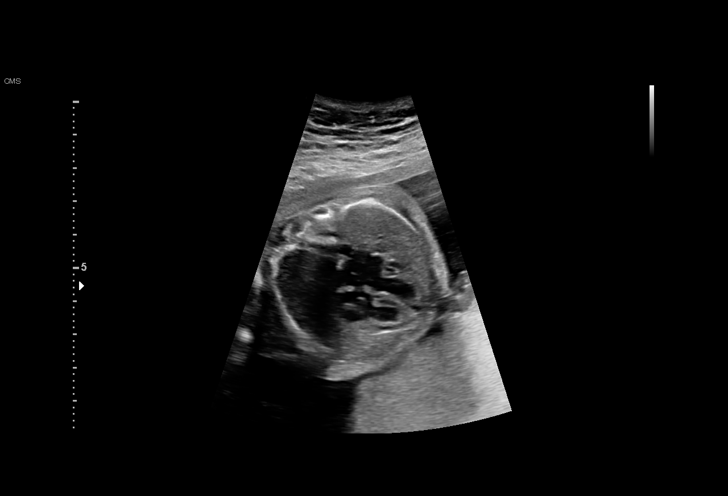

[13 of 28 positions shown; findings below may reference images not displayed]

----------------------------------------------------------------------

 ----------------------------------------------------------------------
Indications

  Antenatal follow-up for nonvisualized fetal
  anatomy
  Advanced maternal age multigravida 35+,
  second trimester (33 at delivery)
  23 weeks gestation of pregnancy
 ----------------------------------------------------------------------
Fetal Evaluation

 Num Of Fetuses:          1
 Fetal Heart Rate(bpm):   162
 Cardiac Activity:        Observed
 Presentation:            Cephalic
 Placenta:                Posterior
 P. Cord Insertion:       Visualized

 Amniotic Fluid
 AFI FV:      Within normal limits

                             Largest Pocket(cm)

Biometry

 BPD:      58.5  mm     G. Age:  24w 0d         47  %    CI:          73.8  %    70 - 86
                                                         FL/HC:       18.3  %    18.7 -
 HC:      216.3  mm     G. Age:  23w 4d         26  %    HC/AC:       1.10       1.05 -
 AC:      195.9  mm     G. Age:  24w 2d         54  %    FL/BPD:      67.7  %    71 - 87
 FL:       39.6  mm     G. Age:  22w 5d         10  %    FL/AC:       20.2  %    20 - 24
 HUM:      36.6  mm     G. Age:  22w 5d         16  %

 Est. FW:     614   gm     1 lb 6 oz     48  %
OB History

 Gravidity:    3         Term:   2
 Living:       2
Gestational Age

 LMP:           26w 0d        Date:  10/06/18                 EDD:   07/13/19
 U/S Today:     23w 5d                                        EDD:   07/29/19
 Best:          23w 6d     Det. By:  U/S  (02/14/19)          EDD:   07/28/19
Anatomy

 Cranium:               Appears normal         LVOT:                   Appears normal
 Cavum:                 Appears normal         Aortic Arch:            Appears normal
 Ventricles:            Appears normal         Ductal Arch:            Appears normal
 Choroid Plexus:        Appears normal         Diaphragm:              Appears normal
 Cerebellum:            Appears normal         Stomach:                Appears normal, left
                                                                       sided
 Posterior Fossa:       Appears normal         Abdomen:                Appears normal
 Nuchal Fold:           Previously seen        Abdominal Wall:         Appears nml (cord
                                                                       insert, abd wall)
 Face:                  Appears normal         Cord Vessels:           Appears normal (3
                        (orbits and profile)                           vessel cord)
 Lips:                  Appears normal         Kidneys:                Appear normal
 Palate:                Appears normal         Bladder:                Appears normal
 Thoracic:              Appears normal         Spine:                  Appears normal
 Heart:                 Appears normal         Upper Extremities:      Previously seen
                        (4CH, axis, and
                        situs)
 RVOT:                  Appears normal         Lower Extremities:      Previously seen

 Other:  5th digit visualized.
Cervix Uterus Adnexa

 Cervix
 Length:            3.1  cm.
 Normal appearance by transabdominal scan.

 Uterus
 No abnormality visualized.

 Left Ovary
 Within normal limits.

 Right Ovary
 Within normal limits.

 Cul De Sac
 No free fluid seen.

 Adnexa
 No abnormality visualized.
Impression

 Patient returned for completion of fetal anatomy.
 Amniotic fluid is normal and good fetal activity is seen. Fetal
 growth is appropriate for gestational age. Fetal anatomy
 appears normal.
Recommendations

 Fetal growth assessment at 32 weeks.
                 Lippert, Khawar

## 2020-05-25 IMAGING — US US MFM OB FOLLOW UP
1 series · 14 of 28 positions shown · non-contrast
Comparison: none

[Series 1: us mfm ob follow up · 52 acquisitions, 14 frames shown]
[im 2/52]
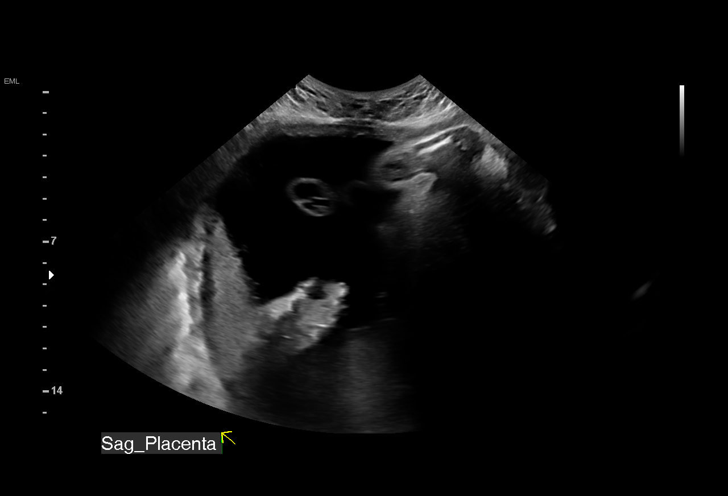
[im 6/52]
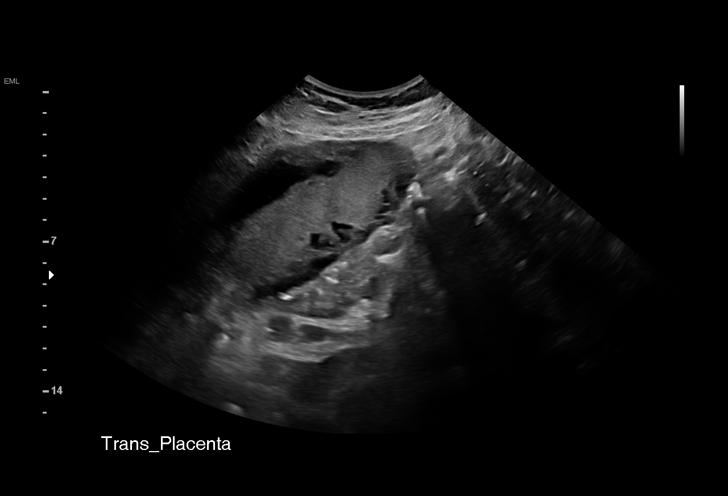
[im 10/52]
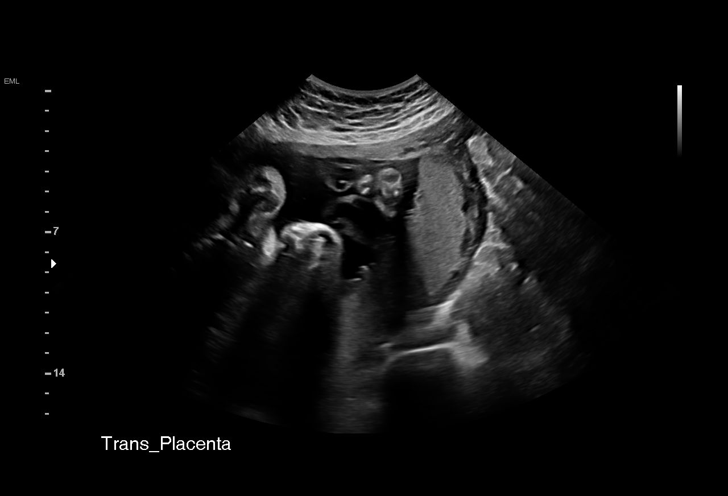
[im 14/52]
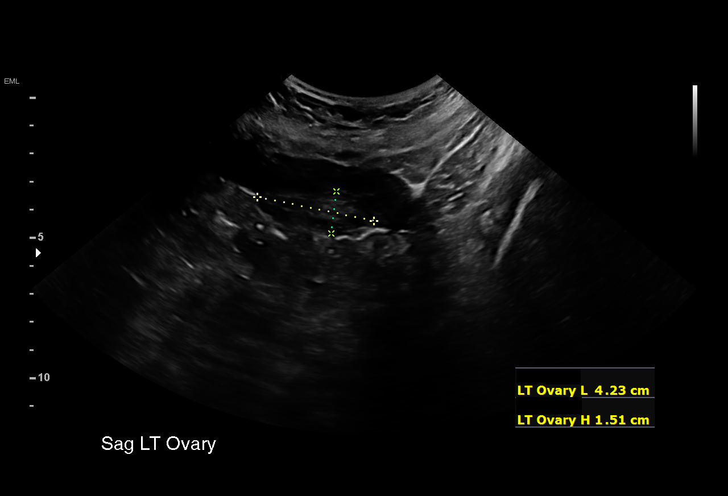
[im 18/52]
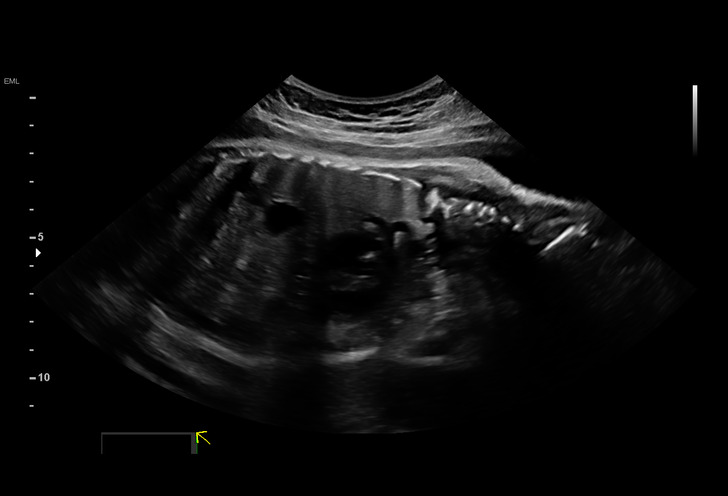
[im 21/52]
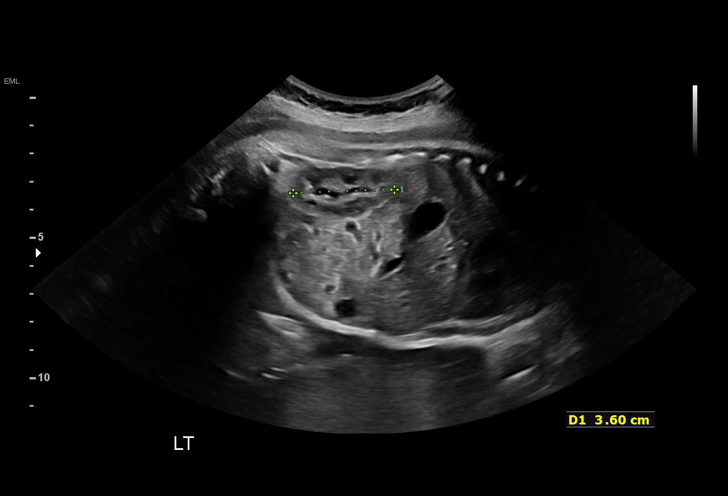
[im 25/52]
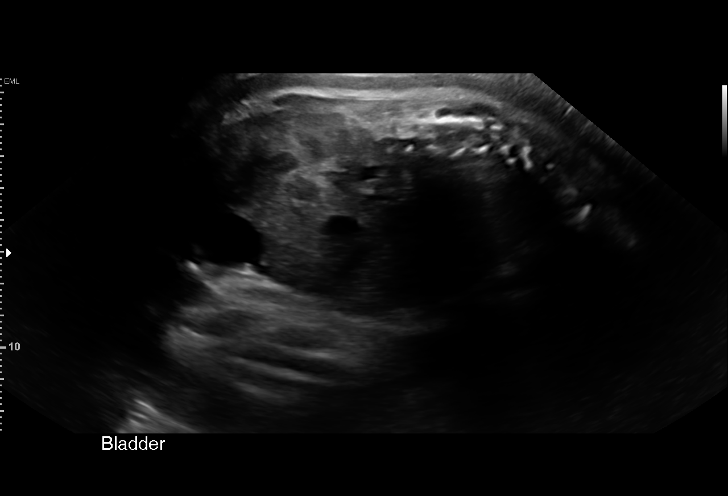
[im 29/52]
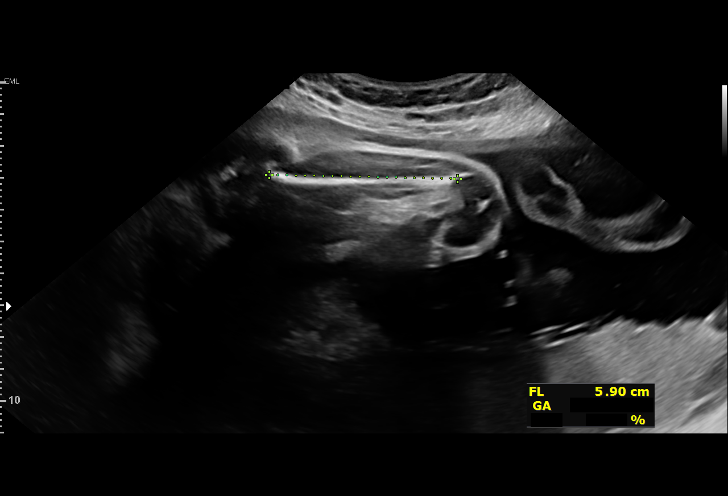
[im 33/52]
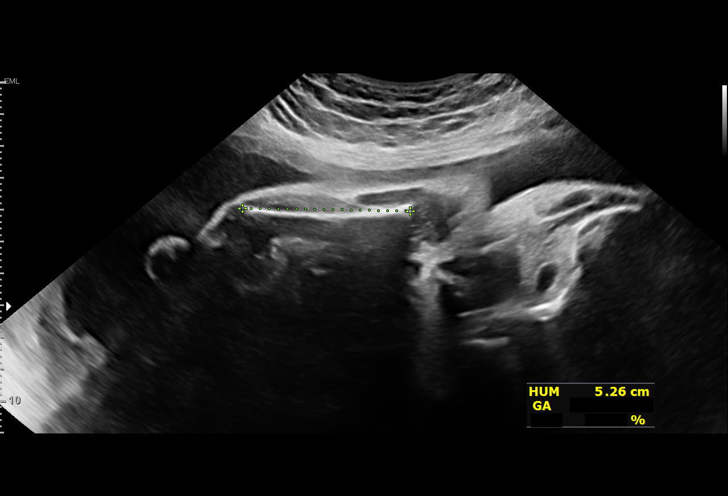
[im 36/52]
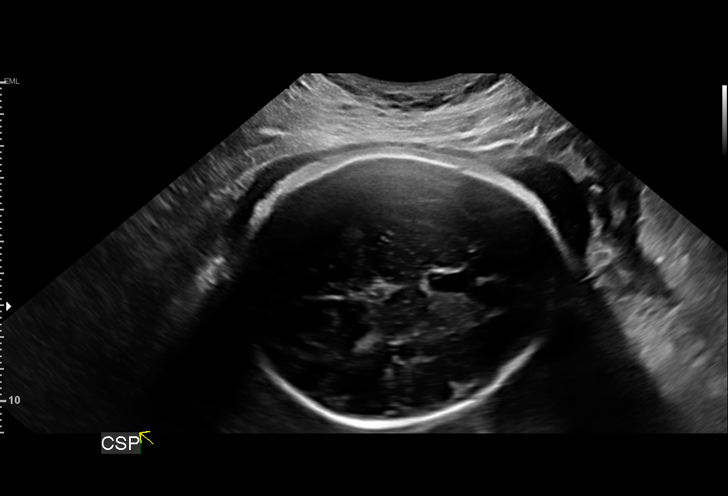
[im 40/52]
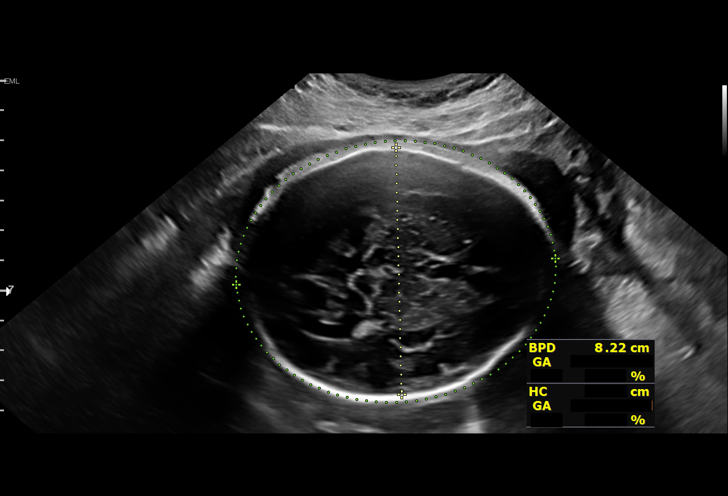
[im 44/52]
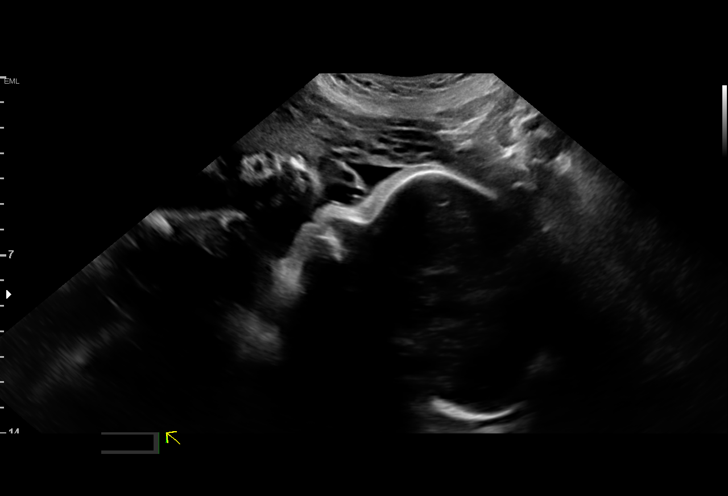
[im 48/52]
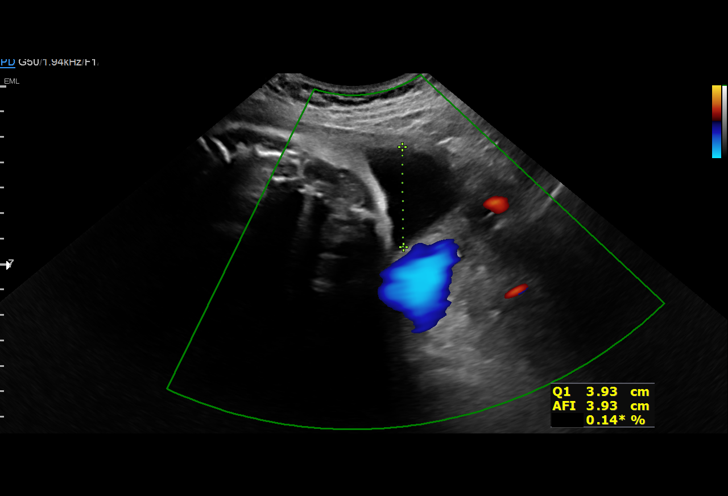
[im 52/52]
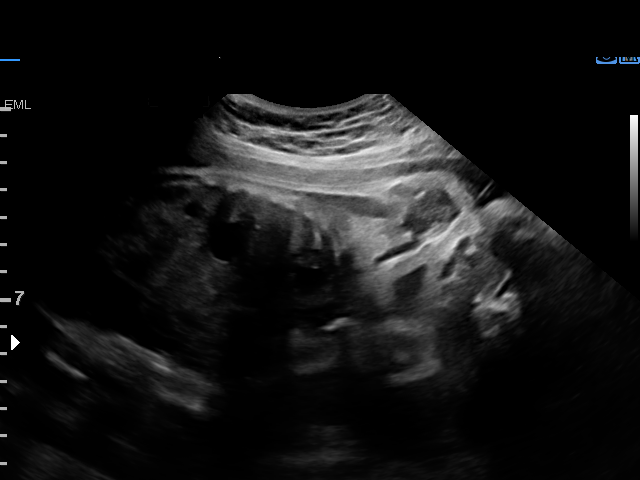

[14 of 28 positions shown; findings below may reference images not displayed]

ABDOUL NOURO
 ----------------------------------------------------------------------

 ----------------------------------------------------------------------
Indications

  31 weeks gestation of pregnancy
  Antenatal follow-up for nonvisualized fetal
  anatomy
  Advanced maternal age multigravida 35+,
  second trimester (33 at delivery)
 ----------------------------------------------------------------------
Vital Signs

 BMI:
Fetal Evaluation

 Num Of Fetuses:         1
 Fetal Heart Rate(bpm):  153
 Cardiac Activity:       Observed
 Presentation:           Cephalic
 Placenta:               Posterior
 P. Cord Insertion:      Visualized, central

 Amniotic Fluid
 AFI FV:      Within normal limits

 AFI Sum(cm)     %Tile       Largest Pocket(cm)
 14.89           52

 RUQ(cm)       RLQ(cm)       LUQ(cm)        LLQ(cm)

Biometry
 BPD:      82.7  mm     G. Age:  33w 2d         81  %    CI:        74.51   %    70 - 86
                                                         FL/HC:      19.3   %    19.1 -
 HC:      304.1  mm     G. Age:  33w 6d         66  %    HC/AC:      1.15        0.96 -
 AC:      265.2  mm     G. Age:  30w 4d         17  %    FL/BPD:     70.9   %    71 - 87
 FL:       58.6  mm     G. Age:  30w 4d         10  %    FL/AC:      22.1   %    20 - 24
 HUM:      52.5  mm     G. Age:  30w 4d         25  %
 LV:        7.2  mm

 Est. FW:    4808  gm    3 lb 12 oz      42  %
OB History

 Gravidity:    3         Term:   2
 Living:       2
Gestational Age

 LMP:           34w 0d        Date:  10/06/18                 EDD:   07/13/19
 U/S Today:     32w 1d                                        EDD:   07/26/19
 Best:          31w 6d     Det. By:  U/S  (02/14/19)          EDD:   07/28/19
Anatomy

 Cranium:               Appears normal         LVOT:                   Previously seen
 Cavum:                 Appears normal         Aortic Arch:            Previously seen
 Ventricles:            Appears normal         Ductal Arch:            Previously seen
 Choroid Plexus:        Appears normal         Diaphragm:              Appears normal
 Cerebellum:            Appears normal         Stomach:                Appears normal, left
                                                                       sided
 Posterior Fossa:       Appears normal         Abdomen:                Previously seen
 Nuchal Fold:           Not applicable (>20    Abdominal Wall:         Previously seen
                        wks GA)
 Face:                  Appears normal         Cord Vessels:           Previously seen
                        (orbits and profile)
 Lips:                  Previously seen        Kidneys:                Previously seen
 Palate:                Previously seen        Bladder:                Previously seen
 Thoracic:              Appears normal         Spine:                  Previously seen
 Heart:                 Previously seen        Upper Extremities:      Previously seen
 RVOT:                  Previously seen        Lower Extremities:      Previously seen

 Other:  Heels and 5th digit previously seen.
Cervix Uterus Adnexa

 Cervix
 Not visualized (advanced GA >48wks)

 Left Ovary
 Within normal limits.

 Right Ovary
 Not visualized.

 Adnexa
 No abnormality visualized.
Impression

 Normal interval growth.
Recommendations

 Follow up as clinically indicated.

## 2020-05-27 ENCOUNTER — Ambulatory Visit (HOSPITAL_COMMUNITY)
Admission: EM | Admit: 2020-05-27 | Discharge: 2020-05-27 | Disposition: A | Discharge status: Home / Self Care | Payer: Medicaid Other | Attending: Family Medicine | Admitting: Family Medicine

## 2020-05-27 ENCOUNTER — Encounter (HOSPITAL_COMMUNITY): Payer: Self-pay

## 2020-05-27 ENCOUNTER — Other Ambulatory Visit: Payer: Self-pay

## 2020-05-27 DIAGNOSIS — N6489 Other specified disorders of breast: Secondary | ICD-10-CM | POA: Diagnosis not present

## 2020-05-27 MED ORDER — WITCH HAZEL-GLYCERIN EX PADS: 1.0000 "application " | MEDICATED_PAD | CUTANEOUS | 12 refills | Status: DC | PRN

## 2020-05-27 MED ORDER — IBUPROFEN 600 MG PO TABS: 600.0000 mg | ORAL_TABLET | Freq: Three times a day (TID) | ORAL | 0 refills | Status: AC | PRN

## 2020-05-27 MED ORDER — PREPLUS 27-1 MG PO TABS: 1.0000 | ORAL_TABLET | Freq: Every day | ORAL | 13 refills | Status: DC

## 2020-05-27 MED ORDER — ACETAMINOPHEN 325 MG PO TABS: 650.0000 mg | ORAL_TABLET | ORAL | 1 refills | Status: DC | PRN

## 2020-05-27 NOTE — ED Triage Notes (Signed)
Pt c/o 6/10 pain in her left breastx2 days. Pt states it's in her actual breast and not her chest.

## 2020-05-27 NOTE — Discharge Instructions (Addendum)
Warm compresses to the breast multiple times a day for 15 to 20 minutes at a time. Ibuprofen 600 mg every 8 hours If this problem continues or worsens you will need to see your OB/GYN.

## 2020-05-28 NOTE — ED Provider Notes (Signed)
[facility]    CSN: [id number] Arrival date & time: 05/27/20  1124      History   Chief Complaint Chief Complaint  Patient presents with  . breast pain    HPI Tina Byrd is a 34 y.o. female.   Patient is a 34 year old female presents today with left breast pain.  The pain is located at approximately 6:00 to breast and nipple area.  Tender with palpation.  There has been no redness, swelling, nipple drainage, fevers.  She is currently still breast-feeding.  Family history of breast cancer. She has not done anything to treat the symptoms.   ROS per HPI      History reviewed. No pertinent past medical history.  Patient Active Problem List   Diagnosis Date Noted  . Nexplanon in place 09/03/2019  . History of postpartum hypertension 01/03/2019    History reviewed. No pertinent surgical history.  OB History    Gravida  3   Para  2   Term  2   Preterm      AB      Living  2     SAB      TAB      Ectopic      Multiple  0   Live Births  2            Home Medications    Prior to Admission medications   Medication Sig Start Date End Date Taking? Authorizing Provider  ibuprofen (ADVIL) 600 MG tablet Take 1 tablet (600 mg total) by mouth every 8 (eight) hours as needed. 05/27/20   Janace Aris, NP    Family History Family History  Problem Relation Age of Onset  . Diabetes Mother   . Lung disease Mother        "the blood vessel to lung is too small  . Diabetes Father   . Hypertension Father     Social History Social History   Tobacco Use  . Smoking status: Never Smoker  . Smokeless tobacco: Never Used  Vaping Use  . Vaping Use: Never used  Substance Use Topics  . Alcohol use: No    Alcohol/week: 0.0 standard drinks  . Drug use: No     Allergies   Patient has no known allergies.   Review of Systems Review of Systems   Physical Exam Triage Vital Signs ED Triage Vitals [05/27/20 1217]  Enc Vitals Group     BP  122/84     Pulse Rate 73     Resp 16     Temp 98.2 F (36.8 C)     Temp Source Oral     SpO2 99 %     Weight 140 lb (63.5 kg)     Height 5' (1.524 m)     Head Circumference      Peak Flow      Pain Score 6     Pain Loc      Pain Edu?      Excl. in GC?    No data found.  Updated Vital Signs BP 122/84   Pulse 73   Temp 98.2 F (36.8 C) (Oral)   Resp 16   Ht 5' (1.524 m)   Wt 140 lb (63.5 kg)   SpO2 99%   BMI 27.34 kg/m   Visual Acuity Right Eye Distance:   Left Eye Distance:   Bilateral Distance:    Right Eye Near:   Left Eye Near:  Bilateral Near:     Physical Exam Vitals and nursing note reviewed.  Constitutional:      General: She is not in acute distress.    Appearance: Normal appearance. She is not ill-appearing, toxic-appearing or diaphoretic.  HENT:     Head: Normocephalic.     Nose: Nose normal.  Eyes:     Conjunctiva/sclera: Conjunctivae normal.  Pulmonary:     Effort: Pulmonary effort is normal.  Chest:     Breasts:        Right: Normal.        Left: No swelling, bleeding, inverted nipple, nipple discharge, skin change or tenderness.       Comments: Small palpable lump to left breast at 6 o'clock  Musculoskeletal:        General: Normal range of motion.     Cervical back: Normal range of motion.  Lymphadenopathy:     Upper Body:     Left upper body: No supraclavicular or axillary adenopathy.  Skin:    General: Skin is warm and dry.     Findings: No rash.  Neurological:     Mental Status: She is alert.  Psychiatric:        Mood and Affect: Mood normal.      UC Treatments / Results  Labs (all labs ordered are listed, but only abnormal results are displayed) Labs Reviewed - No data to display  EKG   Radiology No results found.  Procedures Procedures (including critical care time)  Medications Ordered in UC Medications - No data to display  Initial Impression / Assessment and Plan / UC Course  I have reviewed the  triage vital signs and the nursing notes.  Pertinent labs & imaging results that were available during my care of the patient were reviewed by me and considered in my medical decision making (see chart for details).     Occlusion of breast duct Most likely diagnosis based on palpable lump Recommended warm compresses to breast multiple times a day for 15 to 20 minutes at a time.  Ibuprofen 600 mg every 8 hours. Recommend follow-up with OB/GYN for any continued issues Final Clinical Impressions(s) / UC Diagnoses   Final diagnoses:  Occlusion of breast duct     Discharge Instructions     Warm compresses to the breast multiple times a day for 15 to 20 minutes at a time. Ibuprofen 600 mg every 8 hours If this problem continues or worsens you will need to see your OB/GYN.    ED Prescriptions    Medication Sig Dispense Auth. Provider   ibuprofen (ADVIL) 600 MG tablet Take 1 tablet (600 mg total) by mouth every 8 (eight) hours as needed. 30 tablet Marketia Stallsmith A, NP   acetaminophen (TYLENOL) 325 MG tablet  (Status: Discontinued) Take 2 tablets (650 mg total) by mouth every 4 (four) hours as needed (for pain scale < 4). 30 tablet Alexiz Cothran A, NP   Prenatal Vit-Fe Fumarate-FA (PREPLUS) 27-1 MG TABS  (Status: Discontinued) Take 1 tablet by mouth daily. 30 tablet Loura Halt A, NP   witch hazel-glycerin (TUCKS) pad  (Status: Discontinued) Apply 1 application topically as needed for hemorrhoids. 40 each Orvan July, NP     PDMP not reviewed this encounter.   Orvan July, NP 05/28/20 0825

## 2020-08-01 ENCOUNTER — Encounter: Payer: Self-pay | Admitting: Obstetrics and Gynecology

## 2020-08-01 ENCOUNTER — Ambulatory Visit: Payer: Medicaid Other | Admitting: Obstetrics and Gynecology

## 2020-08-01 ENCOUNTER — Other Ambulatory Visit: Payer: Self-pay

## 2020-08-01 VITALS — BP 123/81 | HR 74 | Temp 98.4°F | Wt 142.0 lb

## 2020-08-01 DIAGNOSIS — N644 Mastodynia: Secondary | ICD-10-CM | POA: Diagnosis not present

## 2020-08-01 NOTE — Progress Notes (Signed)
   GYNECOLOGY OFFICE VISIT NOTE  History:  34 y.o. [id number] here today for breast pain. Reports pain over the weekend where the "milk didn't want to come out" and that it was quite painful when her 34 yr old was trying to feed. Denies fever, chills, redness. She put a warm compress which helped. Then, the milk started coming out again normally and her pain is much improved. Feeling better now.  History reviewed. No pertinent past medical history.  History reviewed. No pertinent surgical history.   Current Outpatient Medications:  .  ibuprofen (ADVIL) 600 MG tablet, Take 1 tablet (600 mg total) by mouth every 8 (eight) hours as needed. (Patient not taking: Reported on 08/01/2020), Disp: 30 tablet, Rfl: 0  The following portions of the patient's history were reviewed and updated as appropriate: allergies, current medications, past family history, past medical history, past social history, past surgical history and problem list.   Review of Systems:  Pertinent items noted in HPI and remainder of comprehensive ROS otherwise negative.   Objective:  Physical Exam BP 123/81   Pulse 74   Temp 98.4 F (36.9 C) (Oral)   Wt 142 lb (64.4 kg)   BMI 27.73 kg/m  CONSTITUTIONAL: Well-developed, well-nourished female in no acute distress.  HENT:  Normocephalic, atraumatic. External right and left ear normal. Oropharynx is clear and moist EYES: Conjunctivae and EOM are normal. Pupils are equal, round, and reactive to light. No scleral icterus.  NECK: Normal range of motion, supple, no masses SKIN: Skin is warm and dry. No rash noted. Not diaphoretic. No erythema. No pallor. NEUROLOGIC: Alert and oriented to person, place, and time. Normal reflexes, muscle tone coordination. No cranial nerve deficit noted. PSYCHIATRIC: Normal mood and affect. Normal behavior. Normal judgment and thought content. CARDIOVASCULAR: Normal heart rate noted RESPIRATORY: Effort normal, no problems with respiration  noted BREASTS: left breast with no erythema, no tenderness, no nodules or any other areas of firmness, no pain with exam ABDOMEN: Soft, no distention noted.   PELVIC: deferred MUSCULOSKELETAL: Normal range of motion. No edema noted.  Exam done with chaperone present.  Labs and Imaging No results found.  Assessment & Plan:   1. Breast pain Suspect clogged duct which has resolved Return if pain returns   Routine preventative health maintenance measures emphasized. Please refer to After Visit Summary for other counseling recommendations.   Return if symptoms worsen or fail to improve.   Total face-to-face time with patient: 12 minutes. Over 50% of encounter was spent on counseling and coordination of care.   Baldemar Lenis, M.D. Attending [facility] [employer] [facility])

## 2020-08-01 NOTE — Progress Notes (Signed)
RGYN patient present for problem visit today.Seen in ED on 05/27/20 for Breast Pain per notes if pain continued pt needed to F/U with OB/GYN.   CC: Left Breast Pain. Onset :Sunday night Not warm to touch ,denies any Bleeding from bresat. Pt is currently Breast Feeding 34 yr old.

## 2020-08-07 DIAGNOSIS — H5213 Myopia, bilateral: Secondary | ICD-10-CM | POA: Diagnosis not present

## 2021-01-03 ENCOUNTER — Ambulatory Visit: Payer: Self-pay

## 2021-08-06 ENCOUNTER — Other Ambulatory Visit: Payer: Self-pay | Admitting: Oral Surgery

## 2022-09-13 ENCOUNTER — Encounter: Payer: Self-pay | Admitting: Advanced Practice Midwife

## 2022-09-13 ENCOUNTER — Other Ambulatory Visit (HOSPITAL_COMMUNITY)
Admission: RE | Admit: 2022-09-13 | Discharge: 2022-09-13 | Disposition: A | Discharge status: Home / Self Care | Payer: Medicaid Other | Source: Ambulatory Visit | Attending: Obstetrics and Gynecology | Admitting: Obstetrics and Gynecology

## 2022-09-13 ENCOUNTER — Ambulatory Visit (INDEPENDENT_AMBULATORY_CARE_PROVIDER_SITE_OTHER): Payer: Medicaid Other | Admitting: Advanced Practice Midwife

## 2022-09-13 VITALS — BP 120/85 | HR 71 | Ht 60.0 in | Wt 142.8 lb

## 2022-09-13 DIAGNOSIS — Z01419 Encounter for gynecological examination (general) (routine) without abnormal findings: Secondary | ICD-10-CM

## 2022-09-13 DIAGNOSIS — Z113 Encounter for screening for infections with a predominantly sexual mode of transmission: Secondary | ICD-10-CM

## 2022-09-13 DIAGNOSIS — Z30430 Encounter for insertion of intrauterine contraceptive device: Secondary | ICD-10-CM

## 2022-09-13 DIAGNOSIS — Z3046 Encounter for surveillance of implantable subdermal contraceptive: Secondary | ICD-10-CM | POA: Diagnosis not present

## 2022-09-13 DIAGNOSIS — R635 Abnormal weight gain: Secondary | ICD-10-CM

## 2022-09-13 DIAGNOSIS — Z3009 Encounter for other general counseling and advice on contraception: Secondary | ICD-10-CM | POA: Diagnosis not present

## 2022-09-13 DIAGNOSIS — Z124 Encounter for screening for malignant neoplasm of cervix: Secondary | ICD-10-CM | POA: Insufficient documentation

## 2022-09-13 MED ORDER — ETONOGESTREL 68 MG ~~LOC~~ IMPL: 68.0000 mg | DRUG_IMPLANT | Freq: Once | SUBCUTANEOUS | Status: DC

## 2022-09-13 MED ORDER — LEVONORGESTREL 20 MCG/DAY IU IUD
1.0000 | INTRAUTERINE_SYSTEM | Freq: Once | INTRAUTERINE | Status: AC
  Administered 2022-09-13: 1 via INTRAUTERINE

## 2022-09-13 NOTE — Progress Notes (Addendum)
Subjective:     Tina Byrd is a 36 y.o. female here at [facility] for a routine exam.  Current complaints: monthly menses but often lasts 3-4 weeks with light bleeding. Pt desires Nexplanon removal and reinsertion.  Personal health questionnaire reviewed: yes.  Do you have a primary care provider? no Do you feel safe at home? yes  Flowsheet Row Office Visit from 09/13/2022 in [facility]  PHQ-2 Total Score 0       Health Maintenance Due  Topic Date Due   Hepatitis C Screening  Never done   PAP SMEAR-Modifier  01/03/2022   INFLUENZA VACCINE  07/13/2022     Risk factors for chronic health problems: Smoking: Alchohol/how much: Pt BMI: Body mass index is 27.89 kg/m.   Gynecologic History Patient's last menstrual period was 09/13/2022 (exact date). Contraception: Nexplanon Last Pap: 2020. Results were: normal Last mammogram: n/a.   Obstetric History OB History  Gravida Para Term Preterm AB Living  3 2 2     2   SAB IAB Ectopic Multiple Live Births        0 2    # Outcome Date GA Lbr Len/2nd Weight Sex Delivery Anes PTL Lv  3 Gravida           2 Term 02/04/17 [redacted]w[redacted]d 02:23 / 00:13 5 lb 9.6 oz (2.54 kg) F Vag-Spont Other  LIV  1 Term 12/28/13 [redacted]w[redacted]d 03:32 / 00:24 5 lb 13 oz (2.637 kg) F Vag-Spont Local  LIV     Birth Comments: WNL     The following portions of the patient's history were reviewed and updated as appropriate: allergies, current medications, past family history, past medical history, past social history, past surgical history, and problem list.  Review of Systems Pertinent items noted in HPI and remainder of comprehensive ROS otherwise negative.    Objective:   BP 120/85   Pulse 71   Ht 5' (1.524 m)   Wt 142 lb 12.8 oz (64.8 kg)   LMP 09/13/2022 (Exact Date)   Breastfeeding No   BMI 27.89 kg/m  VS reviewed, nursing note reviewed,  Constitutional: well developed, well nourished, no distress HEENT: normocephalic CV:  normal rate Pulm/chest wall: normal effort Breast Exam:   exam performed: right breast normal without mass, skin or nipple changes or axillary nodes, left breast normal without mass, skin or nipple changes or axillary nodes Abdomen: soft Neuro: alert and oriented x 3 Skin: warm, dry Psych: affect normal Pelvic exam: Performed: Cervix pink, visually closed, without lesion, scant white creamy discharge, vaginal walls and external genitalia normal Bimanual exam: Cervix 0/long/high, firm, anterior, neg CMT, uterus nontender, nonenlarged, adnexa without tenderness, enlargement, or mass     Nexplanon Removal Patient identified, informed consent performed, consent signed.   Appropriate time out taken. Nexplanon site identified.  Area prepped in usual sterile fashon. One ml of 1% lidocaine was used to anesthetize the area at the distal end of the implant. A small stab incision was made right beside the implant on the distal portion.  The Nexplanon rod was grasped using hemostats and removed without difficulty.  There was minimal blood loss. There were no complications.  3 ml of 1% lidocaine was injected around the incision for post-procedure analgesia.  Steri-strips were applied over the small incision.  A pressure bandage was applied to reduce any bruising.  The patient tolerated the procedure well and was given post procedure instructions.  Patient is planning to use IUD  for contraception.  IUD Procedure Note Patient identified, informed consent performed.  Discussed risks of irregular bleeding, cramping, infection, malpositioning or misplacement of the IUD outside the uterus which may require further procedures. Time out was performed.  Urine pregnancy test negative.  Speculum placed in the vagina.  Cervix visualized.  Cleaned with Betadine x 2.  Grasped anteriorly with a single tooth tenaculum.  Uterus sounded to 8 cm.  Mirena IUD placed per manufacturer's recommendations.  Strings trimmed to 3 cm.  Tenaculum was removed, good hemostasis noted.  Patient tolerated procedure well.   Patient was given post-procedure instructions and the Mirena care card with expiration date.  Patient was also asked to check IUD strings periodically and follow up in 4-6 weeks for IUD check.      Assessment/Plan:   1. Routine screening for STI (sexually transmitted infection)  - Cervicovaginal ancillary only( New ) - HIV Antibody (routine testing w rflx) - RPR - Hepatitis B Surface AntiGEN - Hepatitis C Antibody  2. Screening for cervical cancer  - Cytology - PAP( [place])  3. Well woman exam with routine gynecological exam --Overall doing well. Has frequent bleeding with Nexplanon but unsure of other options at today's visit. --Pt is currently having menses/vaginal bleeding --She has neighbors/friends with breast cancer dx so desires clinical breast exam today.  - TSH - Hemoglobin A1c  4. Encounter for counseling regarding contraception --Discussed pt contraceptive plans and reviewed contraceptive methods based on pt preferences and effectiveness.  Pt prefers to try IUD to improve bleeding pattern but still have long acting contraception. - levonorgestrel (MIRENA) 20 MCG/DAY IUD 1 each  5. Encounter for Nexplanon removal --Nexplanon removed without difficulty, see procedure note above  6. Excessive body weight gain --Pt reports weight gain in recent years, difficulty losing weight. She does not have  PCP currently and desires screening for thyroid problems and diabetes. --TSH and A1C today --Pt given contact information for PCPs in the area     No follow-ups on file.   Fatima Blank, CNM 11:01 AM

## 2022-09-13 NOTE — Progress Notes (Signed)
Patient presents for AEX.  Last Pap: 01/03/2019- normal, due today  Last Mammogram: No concerns, but wants routine breast exam  Contraception: Nexplanon placed 07/24/2019, desires removal and new one placed today.  Vaginal/Urinary Symptoms: Occasional vaginal irritation, denies urinary symptoms.  STD Screen: Desires vaginal swab for STD, desires blood work  Other Concerns: Requests thyroid check, due to family hx. Also concerned about diabetes, and wants sugar check due to weight gain.

## 2022-09-14 LAB — HEMOGLOBIN A1C
Est. average glucose Bld gHb Est-mCnc: 123 mg/dL
Hgb A1c MFr Bld: 5.9 % — ABNORMAL HIGH (ref 4.8–5.6)

## 2022-09-14 LAB — CERVICOVAGINAL ANCILLARY ONLY
Chlamydia: NEGATIVE
Comment: NEGATIVE
Comment: NEGATIVE
Comment: NORMAL
Neisseria Gonorrhea: NEGATIVE
Trichomonas: NEGATIVE

## 2022-09-14 LAB — HEPATITIS C ANTIBODY: Hep C Virus Ab: NONREACTIVE

## 2022-09-14 LAB — HEPATITIS B SURFACE ANTIGEN: Hepatitis B Surface Ag: NEGATIVE

## 2022-09-14 LAB — HIV ANTIBODY (ROUTINE TESTING W REFLEX): HIV Screen 4th Generation wRfx: NONREACTIVE

## 2022-09-14 LAB — TSH: TSH: 4.62 u[IU]/mL — ABNORMAL HIGH (ref 0.450–4.500)

## 2022-09-14 LAB — RPR: RPR Ser Ql: NONREACTIVE

## 2022-09-15 LAB — CYTOLOGY - PAP
Comment: NEGATIVE
Diagnosis: NEGATIVE
High risk HPV: NEGATIVE

## 2022-10-04 ENCOUNTER — Telehealth (HOSPITAL_BASED_OUTPATIENT_CLINIC_OR_DEPARTMENT_OTHER): Payer: Self-pay | Admitting: Advanced Practice Midwife

## 2022-10-04 NOTE — Telephone Encounter (Signed)
Received notification that MyChart message to patient was unread.  I called pt to review her abnormal TSH on 09/13/22.  I verified her identity with 2 markers.  TSH was 4.6 on 10/2, slightly above normal.  Pt with fatigue and difficulty with weight loss.  Pt has IUD string check appt scheduled in our office, so consider recheck of TSH or checking other thyroid labs at that time. Pt does not have PCP so will need referral for follow up.

## 2022-10-11 ENCOUNTER — Encounter: Payer: Self-pay | Admitting: Advanced Practice Midwife

## 2022-10-11 ENCOUNTER — Ambulatory Visit: Payer: Medicaid Other | Admitting: Advanced Practice Midwife

## 2022-10-11 VITALS — BP 124/83 | HR 68 | Ht 60.0 in | Wt 146.0 lb

## 2022-10-11 DIAGNOSIS — R7989 Other specified abnormal findings of blood chemistry: Secondary | ICD-10-CM | POA: Diagnosis not present

## 2022-10-11 DIAGNOSIS — Z23 Encounter for immunization: Secondary | ICD-10-CM

## 2022-10-11 DIAGNOSIS — O99285 Endocrine, nutritional and metabolic diseases complicating the puerperium: Secondary | ICD-10-CM | POA: Diagnosis not present

## 2022-10-11 DIAGNOSIS — Z30431 Encounter for routine checking of intrauterine contraceptive device: Secondary | ICD-10-CM | POA: Diagnosis not present

## 2022-10-11 NOTE — Progress Notes (Signed)
   GYNECOLOGY PROGRESS NOTE  History:  36 y.o. G3P2002 presents to [place]  office today for problem IUD string check and follow up lab visit. She reports no problems with IUD, some occasional mild cramping, no bleeding since insertion.  She denies h/a, dizziness, shortness of breath, n/v, or fever/chills.    The following portions of the patient's history were reviewed and updated as appropriate: allergies, current medications, past family history, past medical history, past social history, past surgical history and problem list. Last pap smear on 09/13/22 was normal, neg HRHPV.  There are no preventive care reminders to display for this patient.    Review of Systems:  Pertinent items are noted in HPI.   Objective:  Physical Exam Blood pressure 124/83, pulse 68, height 5' (1.524 m), weight 146 lb (66.2 kg), last menstrual period 09/13/2022. VS reviewed, nursing note reviewed,  Constitutional: well developed, well nourished, no distress HEENT: normocephalic CV: normal rate Pulm/chest wall: normal effort Breast Exam: deferred Abdomen: soft Neuro: alert and oriented x 3 Skin: warm, dry Psych: affect normal Pelvic exam: Cervix pink, visually closed, without lesion, scant white creamy discharge, IUD string visible, ~ 3 cm in length from cervical os, vaginal walls and external genitalia normal   Assessment & Plan:  1. IUD check up --No problems with IUD. IUD due for replacement in 8 years, in 2031, but can be removed if pregnancy is desired, or pt desires removal.  2. Elevated TSH --Repeat labs today. Pt given list of PCPs and encouraged to follow up.   Return for annual exam.   Fatima Blank, CNM 6:07 PM

## 2022-10-11 NOTE — Patient Instructions (Signed)
AREA FAMILY PRACTICE PHYSICIANS  Central/[place]outheast [place] ([id number]) [facility] [address] [place] [place], [place] [address] [phone] Mon-Fri 8:30-12:30, 1:30-5:00 Accepting [facility] [address], [place], [place] [address] [phone] Accepting [facility] at [facility] [address], [place], [place] [address] [phone] Mon-Fri 8:00-5:30 [facility] [address], [place], [place] [address] [phone] [facility] [facility], Thur, Fri 8:30-5:00, Wed 10:00-7:00 (closed 1-2pm) Accepting [facility] [address], [place], [place]  [address] Phone - [phone]   Fax - [phone]  East/[place] [phone]) [facility] [address] [place], [place] [address] [phone] Mon-Fri 8:00-5:00 [facility] at [facility])  [address] Sherian Maroon [place], [place] [address] [phone] Mon-Fri 8:30-5:30, Sat (Oct.-Mar.) 9:00-1:00 Accepting Medicaid  [place] [phone]) [facility] [address], [place], [place] [address] [phone] Mon-Fri 8:00-5:00  [place] [phone]) [facility] at [facility] [address] [place], [place] [address] [phone] Mon-Fri 8:00-5:00 [facility] [address] [place], [place], [place] [address] [phone] Mon-Fri 8:00-5:00 [place] [facility] [address] [place], [place] [address] [phone] Mon-Fri 8:00-5:00 [facility] [address] [place] [address] [phone] Mon-Fri 7:30-5:30  [employer] [phone] & [phone]) [facility] [address] [place], [place] [address] [phone] Mon-Thur 8:00-6:00 Accepting  [facility]  Medicine [address] [place], [place] [address] [phone] Mon-Thur 7:30-7:30, Fri 7:30-4:30 Accepting  [facility] Family [facility] at [facility] [address] [place], [place]  [address] [phone]   Fax - [phone]  [place] [phone] & [phone]) Adult nurse HealthCare at [facility] [address] [place], [place] [address] [phone] Mon-Fri 7:00-5:00 [facility] [address], [place], [place] [address] [phone] Mon-Fri 8:00-5:00 Accepting Medicaid [facility] - [facility] [address] [place], [place], [place] [address] [phone] Mon-Fri 8:00-5:00 Accepting Medicaid  [place] [phone]) [facility] at [facility] [address] [place], [place] [address] [phone] Mon-Fri 8:00-5:00 [facility] - [facility] at [facility]) [address], [place], [place] [address] [phone] Mon-Fri 8:00-5:00 Accepting Medicaid [facility] ([facility] [employer]) [address], [place], [place] [address] [phone] Mon-Fri 8:00-5:30, Sat&Sun by appointment (phones open at 8:30) Accepting [facility] [phone] & [phone]) [facility] [address], [place], [place] [address] [phone] Mon-Thur 8:00-7:00, Fri 8:00-5:00, Sat 8:00-12:00, Sun 9:00-12:00 Accepting Medicaid [facility] - Family Medicine at [facility] [address] [place], [place] [address] [phone] Mon-Thur 8:00-5:00 Accepting Medicaid [facility] - [facility] at [place] [address] [place], [place] [address] [phone] Mon-Fri 8:00-5:30, Sat (Oct.-Mar.) 9:00-1:00 Accepting [facility]  [place] [phone]) [facility] [address]elf[address]dis [place], [place]  [address] [phone] Mon-Fri 8:00-5:00 Accepting [facility]   [place] [phone]) [facility] at [facility] [address], [place], [place] [address] [phone] Mon-Fri 8:00-5:00  HealthCare at [facility] [address] Clint Lipps [place], [place] [address] [phone] Mon-Fri 8:00-5:00 [facility] - [place] [address], [place], [place] [address] [phone] Mon-Fri 8:00-5:00 After hours clinic [address]  [place], [place] [address]) [phone] Mon-Fri 5:00-8:00, Sat 12:00-6:00, Sun 10:00-4:00 Accepting Medicaid [facility]. [address] [place], [place]  [address] [phone]   Fax - [phone]  [place] [phone]) [place] [facility] [address] [place], [place], [place] [address] [phone] Mon-Fri 8:00-5:00 [place] ([place] [facility]) [address] [place], [place],  [address] [phone] Mon-Thur 8:00-7:00, Fri 8:00-5:00, Sat 8:00-12:00

## 2022-10-11 NOTE — Progress Notes (Signed)
Patient presents for IUD string check. Flu vaccine given per pt request. No other concerns at this time.

## 2022-10-12 LAB — T4, FREE: Free T4: 1.01 ng/dL (ref 0.82–1.77)

## 2022-10-12 LAB — TSH: TSH: 5.98 u[IU]/mL — ABNORMAL HIGH (ref 0.450–4.500)

## 2022-10-12 LAB — T3, FREE: T3, Free: 3.4 pg/mL (ref 2.0–4.4)

## 2022-10-15 NOTE — Progress Notes (Signed)
TC to pt to notify of elevated TSH and importance of f/u with PCP. Pt has not called for PCP appt yet. Strongly advised to call today for appt and indicate elevated TSH when making appt. Advised that elevated TSH can cause further health problems. Pt verbalized understanding and states will call for appt with PCP today.

## 2022-10-26 ENCOUNTER — Encounter: Payer: Self-pay | Admitting: Family Medicine

## 2022-10-26 ENCOUNTER — Ambulatory Visit: Payer: Medicaid Other | Admitting: Family Medicine

## 2022-10-26 VITALS — BP 127/89 | HR 67 | Temp 98.1°F | Resp 16 | Ht 60.0 in | Wt 147.2 lb

## 2022-10-26 DIAGNOSIS — H1013 Acute atopic conjunctivitis, bilateral: Secondary | ICD-10-CM

## 2022-10-26 DIAGNOSIS — R7989 Other specified abnormal findings of blood chemistry: Secondary | ICD-10-CM

## 2022-10-26 DIAGNOSIS — G4700 Insomnia, unspecified: Secondary | ICD-10-CM | POA: Diagnosis not present

## 2022-10-26 DIAGNOSIS — Z7689 Persons encountering health services in other specified circumstances: Secondary | ICD-10-CM | POA: Diagnosis not present

## 2022-10-26 MED ORDER — TRAZODONE HCL 50 MG PO TABS: 50.0000 mg | ORAL_TABLET | Freq: Every day | ORAL | 1 refills | Status: AC

## 2022-10-26 MED ORDER — OLOPATADINE HCL 0.1 % OP SOLN: 1.0000 [drp] | Freq: Two times a day (BID) | OPHTHALMIC | 1 refills | Status: AC

## 2022-10-26 NOTE — Progress Notes (Unsigned)
Patient is her to established care. Patient has concerns about her thyroid number been high. Patient has seen GYN and was asked to talk w/PCP Patient has insomnia at night and would like to speak with provider about

## 2022-10-28 NOTE — Progress Notes (Signed)
New Patient Office Visit  Subjective    Patient ID: Tina Byrd, female    DOB: Mar 15, 1986  Age: 36 y.o. MRN: [id number]  CC:  Chief Complaint  Patient presents with   Establish Care    HPI Tina Byrd presents to establish care and for complaint of abnormal labs and itchy eyes with weather change. Patient denies fever/chills or viral sx.    Outpatient Encounter Medications as of 10/26/2022  Medication Sig   olopatadine (PATANOL) 0.1 % ophthalmic solution Place 1 drop into both eyes 2 (two) times daily.   traZODone (DESYREL) 50 MG tablet Take 1 tablet (50 mg total) by mouth at bedtime.   ibuprofen (ADVIL) 600 MG tablet Take 1 tablet (600 mg total) by mouth every 8 (eight) hours as needed. (Patient not taking: Reported on 08/01/2020)   No facility-administered encounter medications on file as of 10/26/2022.    History reviewed. No pertinent past medical history.  History reviewed. No pertinent surgical history.  Family History  Problem Relation Age of Onset   Diabetes Mother    Lung disease Mother        "the blood vessel to lung is too small   Diabetes Father    Hypertension Father     Social History   Socioeconomic History   Marital status: Married    Spouse name: Bubba Camp   Number of children: 1   Years of education: 10   Highest education level: Not on file  Occupational History   Occupation: Housewife  Tobacco Use   Smoking status: Never   Smokeless tobacco: Never  Vaping Use   Vaping Use: Never used  Substance and Sexual Activity   Alcohol use: No    Alcohol/week: 0.0 standard drinks of alcohol   Drug use: No   Sexual activity: Yes    Birth control/protection: None  Other Topics Concern   Not on file  Social History Narrative   Originally from [place]   Was in a camp in [place] for 2 years.   Her husband left 2 sons behind in [place], ages 47 and 45.  He had them with a girlfriend in past.   They plan to bring them to [place] When they have the financial  ability to do so.   Also, 2 yo daughter, Tina Byrd, born in [place].   Lives at home with husband and daughter.   Social Determinants of Health   Financial Resource Strain: Low Risk  (01/03/2019)   Overall Financial Resource Strain (CARDIA)    Difficulty of Paying Living Expenses: Not hard at all  Food Insecurity: No Food Insecurity (01/03/2019)   Hunger Vital Sign    Worried About Running Out of Food in the Last Year: Never true    Ran Out of Food in the Last Year: Never true  Transportation Needs: No Transportation Needs (01/03/2019)   PRAPARE - Administrator, Civil Service (Medical): No    Lack of Transportation (Non-Medical): No  Physical Activity: Sufficiently Active (01/03/2019)   Exercise Vital Sign    Days of Exercise per Week: 7 days    Minutes of Exercise per Session: 150+ min  Stress: No Stress Concern Present (01/03/2019)   [employer] of Occupational Health - Occupational Stress Questionnaire    Feeling of Stress : Only a little  Social Connections: Unknown (01/03/2019)   Social Connection and Isolation Panel [NHANES]    Frequency of Communication with Friends and Family: More than three times a week  Frequency of Social Gatherings with Friends and Family: More than three times a week    Attends Religious Services: More than 4 times per year    Active Member of [employer] or Organizations: No    Attends Banker Meetings: Never    Marital Status: Not on file  Intimate Partner Violence: Not At Risk (01/03/2019)   Humiliation, Afraid, Rape, and Kick questionnaire    Fear of Current or Ex-Partner: No    Emotionally Abused: No    Physically Abused: No    Sexually Abused: No    Review of Systems  Eyes:  Positive for redness. Negative for pain.  Psychiatric/Behavioral:  The patient has insomnia.   All other systems reviewed and are negative.       Objective    BP 127/89   Pulse 67   Temp 98.1 F (36.7 C) (Oral)   Resp 16   Ht 5' (1.524 m)    Wt 147 lb 3.2 oz (66.8 kg)   BMI 28.75 kg/m   Physical Exam Vitals and nursing note reviewed.  Constitutional:      General: She is not in acute distress. Cardiovascular:     Rate and Rhythm: Normal rate and regular rhythm.  Pulmonary:     Effort: Pulmonary effort is normal.     Breath sounds: Normal breath sounds.  Abdominal:     Palpations: Abdomen is soft.     Tenderness: There is no abdominal tenderness.  Neurological:     General: No focal deficit present.     Mental Status: She is alert and oriented to person, place, and time.         Assessment & Plan:   1. Abnormal TSH Discussed with patient . Reassurance given. Observant management in light of normal T4 and T3. Will monitor  2. Allergic conjunctivitis of both eyes Patanol prescribed.   3. Insomnia, unspecified type Trazodone prescribed.   4. Encounter to establish care   Return in about 6 months (around 04/26/2023) for follow up.   Tommie Raymond, MD

## 2023-04-26 ENCOUNTER — Ambulatory Visit: Payer: Medicaid Other | Admitting: Family Medicine

## 2023-04-26 VITALS — BP 119/82 | HR 67 | Temp 98.1°F | Resp 16 | Wt 145.0 lb

## 2023-04-26 DIAGNOSIS — R7989 Other specified abnormal findings of blood chemistry: Secondary | ICD-10-CM

## 2023-04-26 DIAGNOSIS — M25511 Pain in right shoulder: Secondary | ICD-10-CM | POA: Diagnosis not present

## 2023-04-26 DIAGNOSIS — Z789 Other specified health status: Secondary | ICD-10-CM | POA: Diagnosis not present

## 2023-04-27 LAB — THYROID PANEL WITH TSH
Free Thyroxine Index: 1.9 (ref 1.2–4.9)
T3 Uptake Ratio: 27 % (ref 24–39)
T4, Total: 7.1 ug/dL (ref 4.5–12.0)
TSH: 3.62 u[IU]/mL (ref 0.450–4.500)

## 2023-04-29 ENCOUNTER — Encounter: Payer: Self-pay | Admitting: Family Medicine

## 2023-04-29 NOTE — Progress Notes (Signed)
Established Patient Office Visit  Subjective    Patient ID: Tina Byrd, female    DOB: September 21, 1986  Age: 37 y.o. MRN: [id number]  CC:  Chief Complaint  Patient presents with   Follow-up    HPI Tina Byrd presents for follow up of TSH. Patient also comoplaints of right shoulder pain but denies known trauma or injury. This visit was aided by an interpreter.    Outpatient Encounter Medications as of 04/26/2023  Medication Sig   olopatadine (PATANOL) 0.1 % ophthalmic solution Place 1 drop into both eyes 2 (two) times daily.   traZODone (DESYREL) 50 MG tablet Take 1 tablet (50 mg total) by mouth at bedtime.   ibuprofen (ADVIL) 600 MG tablet Take 1 tablet (600 mg total) by mouth every 8 (eight) hours as needed. (Patient not taking: Reported on 08/01/2020)   No facility-administered encounter medications on file as of 04/26/2023.    No past medical history on file.  No past surgical history on file.  Family History  Problem Relation Age of Onset   Diabetes Mother    Lung disease Mother        "the blood vessel to lung is too small   Diabetes Father    Hypertension Father     Social History   Socioeconomic History   Marital status: Married    Spouse name: Bubba Camp   Number of children: 1   Years of education: 10   Highest education level: Not on file  Occupational History   Occupation: Housewife  Tobacco Use   Smoking status: Never   Smokeless tobacco: Never  Vaping Use   Vaping Use: Never used  Substance and Sexual Activity   Alcohol use: No    Alcohol/week: 0.0 standard drinks of alcohol   Drug use: No   Sexual activity: Yes    Birth control/protection: None  Other Topics Concern   Not on file  Social History Narrative   Originally from [place]   Was in a camp in [place] for 2 years.   Her husband left 2 sons behind in [place], ages 67 and 30.  He had them with a girlfriend in past.   They plan to bring them to [place] When they have the financial ability to do so.    Also, 2 yo daughter, [name], born in [place].   Lives at home with husband and daughter.   Social Determinants of Health   Financial Resource Strain: Low Risk  (01/03/2019)   Overall Financial Resource Strain (CARDIA)    Difficulty of Paying Living Expenses: Not hard at all  Food Insecurity: No Food Insecurity (01/03/2019)   Hunger Vital Sign    Worried About Running Out of Food in the Last Year: Never true    Ran Out of Food in the Last Year: Never true  Transportation Needs: No Transportation Needs (01/03/2019)   PRAPARE - Administrator, Civil Service (Medical): No    Lack of Transportation (Non-Medical): No  Physical Activity: Sufficiently Active (01/03/2019)   Exercise Vital Sign    Days of Exercise per Week: 7 days    Minutes of Exercise per Session: 150+ min  Stress: No Stress Concern Present (01/03/2019)   [employer] of Occupational Health - Occupational Stress Questionnaire    Feeling of Stress : Only a little  Social Connections: Unknown (01/03/2019)   Social Connection and Isolation Panel [NHANES]    Frequency of Communication with Friends and Family: More than three times a week  Frequency of Social Gatherings with Friends and Family: More than three times a week    Attends Religious Services: More than 4 times per year    Active Member of [employer] or Organizations: No    Attends Banker Meetings: Never    Marital Status: Not on file  Intimate Partner Violence: Not At Risk (01/03/2019)   Humiliation, Afraid, Rape, and Kick questionnaire    Fear of Current or Ex-Partner: No    Emotionally Abused: No    Physically Abused: No    Sexually Abused: No    Review of Systems  All other systems reviewed and are negative.       Objective    BP 119/82   Pulse 67   Temp 98.1 F (36.7 C) (Oral)   Resp 16   Wt 145 lb (65.8 kg)   SpO2 98%   BMI 28.32 kg/m   Physical Exam Vitals and nursing note reviewed.  Constitutional:      General:  She is not in acute distress. Cardiovascular:     Rate and Rhythm: Normal rate and regular rhythm.  Pulmonary:     Effort: Pulmonary effort is normal.     Breath sounds: Normal breath sounds.  Abdominal:     Palpations: Abdomen is soft.     Tenderness: There is no abdominal tenderness.  Musculoskeletal:     Right shoulder: Tenderness present. No swelling or deformity. Normal range of motion.  Neurological:     General: No focal deficit present.     Mental Status: She is alert and oriented to person, place, and time.         Assessment & Plan:   1. Abnormal TSH Monitoring labs ordered - Thyroid Panel With TSH  2. Right shoulder pain, unspecified chronicity Exercises given. Tyelnol/nsaids prn. Patient deferred further eval/mgt at this time.   3. Language barrier to communication   Return in about 3 months (around 07/27/2023) for physical.   Tommie Raymond, MD

## 2023-07-27 ENCOUNTER — Encounter: Payer: Self-pay | Admitting: Family Medicine

## 2023-07-27 ENCOUNTER — Other Ambulatory Visit (HOSPITAL_COMMUNITY)
Admission: RE | Admit: 2023-07-27 | Discharge: 2023-07-27 | Disposition: A | Discharge status: Home / Self Care | Payer: Medicaid Other | Source: Ambulatory Visit | Attending: Family Medicine | Admitting: Family Medicine

## 2023-07-27 ENCOUNTER — Ambulatory Visit (INDEPENDENT_AMBULATORY_CARE_PROVIDER_SITE_OTHER): Payer: Medicaid Other | Admitting: Family Medicine

## 2023-07-27 VITALS — BP 127/81 | HR 72 | Temp 98.1°F | Resp 16 | Ht 60.0 in | Wt 145.0 lb

## 2023-07-27 DIAGNOSIS — E049 Nontoxic goiter, unspecified: Secondary | ICD-10-CM | POA: Diagnosis not present

## 2023-07-27 DIAGNOSIS — Z13220 Encounter for screening for lipoid disorders: Secondary | ICD-10-CM | POA: Diagnosis not present

## 2023-07-27 DIAGNOSIS — Z0000 Encounter for general adult medical examination without abnormal findings: Secondary | ICD-10-CM | POA: Insufficient documentation

## 2023-07-27 DIAGNOSIS — R7989 Other specified abnormal findings of blood chemistry: Secondary | ICD-10-CM | POA: Diagnosis not present

## 2023-07-27 DIAGNOSIS — Z130 Encounter for screening for diseases of the blood and blood-forming organs and certain disorders involving the immune mechanism: Secondary | ICD-10-CM | POA: Diagnosis not present

## 2023-07-27 NOTE — Progress Notes (Signed)
Established Patient Office Visit  Subjective    Patient ID: Tina Byrd, female    DOB: 02-Dec-1986  Age: 37 y.o. MRN: [id number]  CC:  Chief Complaint  Patient presents with   Annual Exam    HPI Tina Byrd presents rot routine annual exam. Patient denies acute complaints or concerns.    Outpatient Encounter Medications as of 07/27/2023  Medication Sig   ibuprofen (ADVIL) 600 MG tablet Take 1 tablet (600 mg total) by mouth every 8 (eight) hours as needed.   olopatadine (PATANOL) 0.1 % ophthalmic solution Place 1 drop into both eyes 2 (two) times daily.   traZODone (DESYREL) 50 MG tablet Take 1 tablet (50 mg total) by mouth at bedtime.   No facility-administered encounter medications on file as of 07/27/2023.    No past medical history on file.  No past surgical history on file.  Family History  Problem Relation Age of Onset   Diabetes Mother    Lung disease Mother        "the blood vessel to lung is too small   Diabetes Father    Hypertension Father     Social History   Socioeconomic History   Marital status: Married    Spouse name: Bubba Camp   Number of children: 1   Years of education: 10   Highest education level: Not on file  Occupational History   Occupation: Housewife  Tobacco Use   Smoking status: Never   Smokeless tobacco: Never  Vaping Use   Vaping status: Never Used  Substance and Sexual Activity   Alcohol use: No    Alcohol/week: 0.0 standard drinks of alcohol   Drug use: No   Sexual activity: Yes    Birth control/protection: None  Other Topics Concern   Not on file  Social History Narrative   Originally from [place]   Was in a camp in [place] for 2 years.   Her husband left 2 sons behind in [place], ages 37 and 66.  He had them with a girlfriend in past.   They plan to bring them to [place] When they have the financial ability to do so.   Also, 2 yo daughter, Tina Byrd, born in [place].   Lives at home with husband and daughter.   Social Determinants  of Health   Financial Resource Strain: Low Risk  (01/03/2019)   Overall Financial Resource Strain (CARDIA)    Difficulty of Paying Living Expenses: Not hard at all  Food Insecurity: No Food Insecurity (01/03/2019)   Hunger Vital Sign    Worried About Running Out of Food in the Last Year: Never true    Ran Out of Food in the Last Year: Never true  Transportation Needs: No Transportation Needs (01/03/2019)   PRAPARE - Administrator, Civil Service (Medical): No    Lack of Transportation (Non-Medical): No  Physical Activity: Sufficiently Active (01/03/2019)   Exercise Vital Sign    Days of Exercise per Week: 7 days    Minutes of Exercise per Session: 150+ min  Stress: No Stress Concern Present (01/03/2019)   [employer] of Occupational Health - Occupational Stress Questionnaire    Feeling of Stress : Only a little  Social Connections: Unknown (01/03/2019)   Social Connection and Isolation Panel [NHANES]    Frequency of Communication with Friends and Family: More than three times a week    Frequency of Social Gatherings with Friends and Family: More than three times a week    Attends  Religious Services: More than 4 times per year    Active Member of Clubs or Organizations: No    Attends Banker Meetings: Never    Marital Status: Not on file  Intimate Partner Violence: Not At Risk (01/03/2019)   Humiliation, Afraid, Rape, and Kick questionnaire    Fear of Current or Ex-Partner: No    Emotionally Abused: No    Physically Abused: No    Sexually Abused: No    Review of Systems  All other systems reviewed and are negative.       Objective    BP 127/81   Pulse 72   Temp 98.1 F (36.7 C) (Oral)   Resp 16   Ht 5' (1.524 m)   Wt 145 lb (65.8 kg)   SpO2 98%   BMI 28.32 kg/m   Physical Exam Vitals and nursing note reviewed.  Constitutional:      General: She is not in acute distress. HENT:     Head: Normocephalic and atraumatic.     Right Ear:  Tympanic membrane, ear canal and external ear normal.     Left Ear: Tympanic membrane, ear canal and external ear normal.     Nose: Nose normal.     Mouth/Throat:     Mouth: Mucous membranes are moist.     Pharynx: Oropharynx is clear.  Eyes:     Conjunctiva/sclera: Conjunctivae normal.     Pupils: Pupils are equal, round, and reactive to light.  Neck:     Thyroid: Thyromegaly present. No thyroid mass or thyroid tenderness.  Cardiovascular:     Rate and Rhythm: Normal rate and regular rhythm.     Heart sounds: Normal heart sounds. No murmur heard. Pulmonary:     Effort: Pulmonary effort is normal. No respiratory distress.     Breath sounds: Normal breath sounds.  Abdominal:     General: There is no distension.     Palpations: Abdomen is soft. There is no mass.     Tenderness: There is no abdominal tenderness.  Musculoskeletal:        General: Normal range of motion.     Cervical back: Normal range of motion and neck supple.  Skin:    General: Skin is warm and dry.  Neurological:     General: No focal deficit present.     Mental Status: She is alert and oriented to person, place, and time.  Psychiatric:        Mood and Affect: Mood normal.        Behavior: Behavior normal.         Assessment & Plan:   1. Annual physical exam  - CMP14+EGFR - Cervicovaginal ancillary only  2. Screening for deficiency anemia  - CBC with Differential  3. Screening for lipid disorders  - Lipid Panel  4. Enlarged thyroid  - TSH - T4, Free - US THYROID; Future   No follow-ups on file.   Tommie Raymond, MD

## 2023-07-27 NOTE — Progress Notes (Signed)
-  Patient is here to have annually  complete physical examination  -Care gap address -labs taken  

## 2023-07-28 LAB — CMP14+EGFR
ALT: 13 IU/L (ref 0–32)
AST: 14 IU/L (ref 0–40)
Albumin: 4.4 g/dL (ref 3.9–4.9)
Alkaline Phosphatase: 96 IU/L (ref 44–121)
BUN/Creatinine Ratio: 13 (ref 9–23)
BUN: 7 mg/dL (ref 6–20)
Bilirubin Total: 0.3 mg/dL (ref 0.0–1.2)
CO2: 22 mmol/L (ref 20–29)
Calcium: 9.5 mg/dL (ref 8.7–10.2)
Chloride: 102 mmol/L (ref 96–106)
Creatinine, Ser: 0.54 mg/dL — ABNORMAL LOW (ref 0.57–1.00)
Globulin, Total: 2.9 g/dL (ref 1.5–4.5)
Glucose: 109 mg/dL — ABNORMAL HIGH (ref 70–99)
Potassium: 4.2 mmol/L (ref 3.5–5.2)
Sodium: 140 mmol/L (ref 134–144)
Total Protein: 7.3 g/dL (ref 6.0–8.5)
eGFR: 120 mL/min/{1.73_m2} (ref >59)

## 2023-07-28 LAB — CBC WITH DIFFERENTIAL/PLATELET
Basophils Absolute: 0 10*3/uL (ref 0.0–0.2)
Basos: 0 %
EOS (ABSOLUTE): 0.2 10*3/uL (ref 0.0–0.4)
Eos: 2 %
Hematocrit: 40.6 % (ref 34.0–46.6)
Hemoglobin: 13.2 g/dL (ref 11.1–15.9)
Immature Grans (Abs): 0 10*3/uL (ref 0.0–0.1)
Immature Granulocytes: 0 %
Lymphocytes Absolute: 2.1 10*3/uL (ref 0.7–3.1)
Lymphs: 24 %
MCH: 29.1 pg (ref 26.6–33.0)
MCHC: 32.5 g/dL (ref 31.5–35.7)
MCV: 89 fL (ref 79–97)
Monocytes Absolute: 0.5 10*3/uL (ref 0.1–0.9)
Monocytes: 6 %
Neutrophils Absolute: 6 10*3/uL (ref 1.4–7.0)
Neutrophils: 68 %
Platelets: 353 10*3/uL (ref 150–450)
RBC: 4.54 x10E6/uL (ref 3.77–5.28)
RDW: 13.3 % (ref 11.7–15.4)
WBC: 8.8 10*3/uL (ref 3.4–10.8)

## 2023-07-28 LAB — CERVICOVAGINAL ANCILLARY ONLY
Bacterial Vaginitis (gardnerella): NEGATIVE
Candida Glabrata: NEGATIVE
Candida Vaginitis: NEGATIVE
Chlamydia: NEGATIVE
Comment: NEGATIVE
Comment: NEGATIVE
Comment: NEGATIVE
Comment: NEGATIVE
Comment: NEGATIVE
Comment: NORMAL
Neisseria Gonorrhea: NEGATIVE
Trichomonas: NEGATIVE

## 2023-07-28 LAB — LIPID PANEL
Chol/HDL Ratio: 3.8 ratio (ref 0.0–4.4)
Cholesterol, Total: 150 mg/dL (ref 100–199)
HDL: 39 mg/dL — ABNORMAL LOW (ref >39)
LDL Chol Calc (NIH): 80 mg/dL (ref 0–99)
Triglycerides: 179 mg/dL — ABNORMAL HIGH (ref 0–149)
VLDL Cholesterol Cal: 31 mg/dL (ref 5–40)

## 2023-07-28 LAB — T4, FREE: Free T4: 1.08 ng/dL (ref 0.82–1.77)

## 2023-07-28 LAB — TSH: TSH: 4.29 u[IU]/mL (ref 0.450–4.500)

## 2023-07-29 ENCOUNTER — Encounter: Payer: Self-pay | Admitting: Family Medicine

## 2023-08-04 ENCOUNTER — Ambulatory Visit (HOSPITAL_COMMUNITY)
Admission: RE | Admit: 2023-08-04 | Discharge: 2023-08-04 | Disposition: A | Discharge status: Home / Self Care | Payer: Medicaid Other | Source: Ambulatory Visit | Attending: Family Medicine | Admitting: Family Medicine

## 2023-08-04 DIAGNOSIS — R221 Localized swelling, mass and lump, neck: Secondary | ICD-10-CM | POA: Diagnosis not present

## 2023-08-04 DIAGNOSIS — E049 Nontoxic goiter, unspecified: Secondary | ICD-10-CM | POA: Diagnosis not present

## 2023-09-02 ENCOUNTER — Telehealth: Payer: Self-pay | Admitting: Family Medicine

## 2023-09-02 NOTE — Telephone Encounter (Signed)
The patient would like for someone to call her back to go over her U/S results from last month with her. Please assist patient further

## 2023-09-05 NOTE — Telephone Encounter (Signed)
Returned patient's call for imaging results. Results given. Patient states she has additional questions about her results. Patient would like to know what chronic thyroiditis is and what is the treatment. Please advise patient.

## 2023-09-05 NOTE — Telephone Encounter (Signed)
Please explain to patient what results mean so she may understand

## 2023-09-07 ENCOUNTER — Encounter: Payer: Self-pay | Admitting: Family Medicine

## 2023-09-07 ENCOUNTER — Telehealth: Payer: Medicaid Other | Admitting: Family Medicine

## 2023-09-07 DIAGNOSIS — E049 Nontoxic goiter, unspecified: Secondary | ICD-10-CM

## 2023-09-07 DIAGNOSIS — E065 Other chronic thyroiditis: Secondary | ICD-10-CM | POA: Diagnosis not present

## 2023-09-07 NOTE — Progress Notes (Signed)
Video connection was lost when less than 50% of the duration of the visit was complete, at which time the remainder of the visit was completed via audio only.Virtual Visit via Video Note  I connected with [name] on 09/07/23 at  3:00 PM EDT by a video enabled telemedicine application and verified that I am speaking with the correct person using two identifiers.  Location: Patient: Tina Byrd Provider: [place]   I discussed the limitations of evaluation and management by telemedicine and the availability of in person appointments. The patient expressed understanding and agreed to proceed.  History of Present Illness: Follow up of thyroiditis as noted by imaging. Patient also having difficulty with singing for past 2-3 months. Also with hair loss.    Observations/Objective:   Assessment and Plan: 1. Chronic thyroiditis U/s  is consistent with chronic thyroiditis although seems to be improving. Patient with symptoms. Will ref to consultant for further eval/mgt  2. Enlarged thyroid As above   Follow Up Instructions: Because of throat symptoms and thyroid enlargement will refer to ENT    I discussed the assessment and treatment plan with the patient. The patient was provided an opportunity to ask questions and all were answered. The patient agreed with the plan and demonstrated an understanding of the instructions.   The patient was advised to call back or seek an in-person evaluation if the symptoms worsen or if the condition fails to improve as anticipated.  I provided 11 minutes of non-face-to-face time during this encounter.   Tommie Raymond, MD

## 2023-09-07 NOTE — Telephone Encounter (Signed)
Visit has been scheduled

## 2023-11-04 ENCOUNTER — Institutional Professional Consult (permissible substitution) (INDEPENDENT_AMBULATORY_CARE_PROVIDER_SITE_OTHER): Payer: Medicaid Other | Admitting: Otolaryngology

## 2023-12-08 ENCOUNTER — Ambulatory Visit (INDEPENDENT_AMBULATORY_CARE_PROVIDER_SITE_OTHER): Payer: Medicaid Other | Admitting: Otolaryngology

## 2023-12-08 ENCOUNTER — Encounter (INDEPENDENT_AMBULATORY_CARE_PROVIDER_SITE_OTHER): Payer: Self-pay | Admitting: Otolaryngology

## 2023-12-08 VITALS — BP 144/86 | HR 77 | Ht 61.0 in | Wt 145.0 lb

## 2023-12-08 DIAGNOSIS — K219 Gastro-esophageal reflux disease without esophagitis: Secondary | ICD-10-CM | POA: Diagnosis not present

## 2023-12-08 DIAGNOSIS — E049 Nontoxic goiter, unspecified: Secondary | ICD-10-CM | POA: Diagnosis not present

## 2023-12-08 DIAGNOSIS — R490 Dysphonia: Secondary | ICD-10-CM | POA: Diagnosis not present

## 2023-12-08 DIAGNOSIS — R7989 Other specified abnormal findings of blood chemistry: Secondary | ICD-10-CM

## 2023-12-08 NOTE — Patient Instructions (Addendum)
-   schedule Endocrinology appointment   [email or web address] - check out this website to learn more about reflux   -Avoid lying down for at least two hours after a meal or after drinking acidic beverages, like soda, or other caffeinated beverages. This can help to prevent stomach contents from flowing back into the esophagus. -Keep your head elevated while you sleep. Using an extra pillow or two can also help to prevent reflux. -Eat smaller and more frequent meals each day instead of a few large meals. This promotes digestion and can aid in preventing heartburn. -Wear loose-fitting clothes to ease pressure on the stomach, which can worsen heartburn and reflux. -Reduce excess weight around the midsection. This can ease pressure on the stomach. Such pressure can force some stomach contents back up the esophagus  - Take Reflux Gourmet (natural supplement available on [employer]) to help with symptoms of chronic throat irritation

## 2023-12-08 NOTE — Progress Notes (Signed)
ENT CONSULT:  Reason for Consult: concern for thyroiditis and elevated TSH   HPI: Discussed the use of AI scribe software for clinical note transcription with the patient, who gave verbal consent to proceed.  History of Present Illness   The patient is a 52 yoF, with a family history of thyroid disease, requested  thyroid check from their primary care physician due to concerns about developing thyroiditis.   The patient's sister had a severe case of thyroid disease, which led to significant weight loss and hair whitening. The patient's TSH was found to be high, and then the TSH levels had normalized four months prior to this consultation. They denied experiencing any symptoms such as pressure or pain over the neck, difficulty swallowing, or breathing issues. However, they reported a change in their voice, particularly when singing, which they noticed during church services. Denies voice loss. Reports no hx of heartburn or taking heartburn medications.  Records Reviewed:  Video visit records by Georganna Skeans 09/07/23 History of Present Illness: Follow up of thyroiditis as noted by imaging. Patient also having difficulty with singing for past 2-3 months. Also with hair loss.    Observations/Objective:     Assessment and Plan: 1. Chronic thyroiditis U/s  is consistent with chronic thyroiditis although seems to be improving. Patient with symptoms. Will ref to consultant for further eval/mgt  Follow Up Instructions: Because of throat symptoms and thyroid enlargement will refer to ENT     History reviewed. No pertinent past medical history.  History reviewed. No pertinent surgical history.  Family History  Problem Relation Age of Onset   Diabetes Mother    Lung disease Mother        "the blood vessel to lung is too small   Diabetes Father    Hypertension Father     Social History:  reports that she has never smoked. She has never used smokeless tobacco. She reports that she does not  drink alcohol and does not use drugs.  Allergies: No Known Allergies  Medications: I have reviewed the patient's current medications.  The PMH, PSH, Medications, Allergies, and SH were reviewed and updated.  ROS: Constitutional: Negative for fever, weight loss and weight gain. Cardiovascular: Negative for chest pain and dyspnea on exertion. Respiratory: Is not experiencing shortness of breath at rest. Gastrointestinal: Negative for nausea and vomiting. Neurological: Negative for headaches. Psychiatric: The patient is not nervous/anxious  Blood pressure (!) 144/86, pulse 77, height 5\' 1"  (1.549 m), weight 145 lb (65.8 kg), SpO2 97%.  PHYSICAL EXAM:  Exam: General: Well-developed, well-nourished Communication and Voice: Clear pitch and clarity Respiratory Respiratory effort: Equal inspiration and expiration without stridor Cardiovascular Peripheral Vascular: Warm extremities with equal color/perfusion Eyes: No nystagmus with equal extraocular motion bilaterally Neuro/Psych/Balance: Patient oriented to person, place, and time; Appropriate mood and affect; Gait is intact with no imbalance; Cranial nerves I-XII are intact Head and Face Inspection: Normocephalic and atraumatic without mass or lesion Palpation: Facial skeleton intact without bony stepoffs Salivary Glands: No mass or tenderness Facial Strength: Facial motility symmetric and full bilaterally ENT Pinna: External ear intact and fully developed External canal: Canal is patent with intact skin Tympanic Membrane: Clear and mobile External Nose: No scar or anatomic deformity Internal Nose: Septum is deviated to the left. No polyp, or purulence. Mucosal edema and erythema present.  Bilateral inferior turbinate hypertrophy.  Lips, Teeth, and gums: Mucosa and teeth intact and viable TMJ: No pain to palpation with full mobility Oral cavity/oropharynx: No erythema or  exudate, no lesions present Nasopharynx: No mass or lesion  with intact mucosa Hypopharynx: Intact mucosa without pooling of secretions Larynx Glottic: Full true vocal cord mobility without lesion or mass Supraglottic: Normal appearing epiglottis and AE folds Interarytenoid Space: Moderate pachydermia&edema Subglottic Space: Patent without lesion or edema Neck Neck and Trachea: Midline trachea without mass or lesion Thyroid: No mass or nodularity Lymphatics: No lymphadenopathy  Procedure:  Preoperative diagnosis: voice changes when singing hoarseness  Postoperative diagnosis:   Same + GERD LPR  Procedure: Flexible fiberoptic laryngoscopy  Surgeon: Ashok Croon, MD  Anesthesia: Topical lidocaine and Afrin Complications: None Condition is stable throughout exam  Indications and consent:  The patient presents to the clinic with Indirect laryngoscopy view was incomplete. Thus it was recommended that they undergo a flexible fiberoptic laryngoscopy. All of the risks, benefits, and potential complications were reviewed with the patient preoperatively and verbal informed consent was obtained.  Procedure: The patient was seated upright in the clinic. Topical lidocaine and Afrin were applied to the nasal cavity. After adequate anesthesia had occurred, I then proceeded to pass the flexible telescope into the nasal cavity. The nasal cavity was patent without rhinorrhea or polyp. The nasopharynx was also patent without mass or lesion. The base of tongue was visualized and was normal. There were no signs of pooling of secretions in the piriform sinuses. The true vocal folds were mobile bilaterally. There were no signs of glottic or supraglottic mucosal lesion or mass. There was moderate interarytenoid pachydermia and post cricoid edema. The telescope was then slowly withdrawn and the patient tolerated the procedure throughout.   Studies Reviewed:  Thyroid U/S CLINICAL DATA:  Palpable abnormality. Enlarged thyroid gland on physical examination.    EXAM: THYROID ULTRASOUND   TECHNIQUE: Ultrasound examination of the thyroid gland and adjacent soft tissues was performed.   COMPARISON:  None Available.   FINDINGS: Parenchymal Echotexture: Moderately heterogenous   Isthmus: 0.7 cm   Right lobe: 5.3 x 2.0 x 2.1 cm   Left lobe: 5.1 x 2.1 x 2.5 cm   _________________________________________________________   Estimated total number of nodules >/= 1 cm: 0   Number of spongiform nodules >/=  2 cm not described below (TR1): 0   Number of mixed cystic and solid nodules >/= 1.5 cm not described below (TR2): 0     No discrete nodules are seen within the thyroid gland. Parenchymal vascularity is subjectively normal to mildly increased. No enlarged or abnormal appearing lymph nodes are identified.   IMPRESSION: Mildly enlarged and moderately heterogeneous thyroid gland without discrete nodules. Heterogeneity and slightly prominent parenchymal vascularity likely reflect some degree of underlying chronic thyroiditis.  TSH levels    Assessment/Plan: Encounter Diagnoses  Name Primary?   Elevated TSH Yes   Enlarged thyroid    Dysphonia    Gastroesophageal reflux disease without esophagitis     Assessment and Plan    Diffuse Thyroid Enlargement on U/S and elevated TSH  Referred for an enlarged thyroid gland. Thyroid ultrasound showed diffuse enlargement without nodules. TSH levels were high one year ago but normalized four months ago. No current symptoms of pressure, pain, dysphagia, or dyspnea. Referral to endocrinology for further evaluation and management. Discussed the importance of thyroid hormone for body functions such as hair growth, skin maintenance, and metabolism. Explained that endocrinology specializes in medical management of thyroid function, including medication options if those are warranted. - Placed referral to endocrinology for further evaluation and management.  Voice Changes/dysphonia Reports voice  changes, particularly  difficulty sustaining singing. Flexible laryngoscopy showed normal vocal folds with b/l movement, no lesions or masses. Possible contributing factors include post-nasal drainage and mild reflux changes.  - Recommended dietary changes to manage reflux, including avoiding late meals and trigger foods. - Provide information on Reflux Gourmet supplement for reflux management.  GERD LPR Reports occasional reflux but does not take medication for it. Reflux may contribute to voice changes. Discussed dietary changes and the use of Reflux Gourmet supplement to manage symptoms. - Recommend dietary changes to manage reflux. - Provide information on Reflux Gourmet supplement for reflux management.  General Health Maintenance Discussed general health maintenance related to reflux management and thyroid health. - Provide after-visit summary with information on managing reflux and dietary changes.  Follow-up -  endocrinology referral. - Follow up with me as needed.        Thank you for allowing me to participate in the care of this patient. Please do not hesitate to contact me with any questions or concerns.   Ashok Croon, MD Otolaryngology [facility] Phone: [phone] Fax: [phone]    12/08/2023, 5:46 PM

## 2024-01-27 ENCOUNTER — Encounter: Payer: Self-pay | Admitting: Family Medicine

## 2024-01-27 ENCOUNTER — Ambulatory Visit: Payer: Medicaid Other | Admitting: Family Medicine

## 2024-01-27 VITALS — BP 146/97 | HR 68 | Temp 98.3°F | Resp 16 | Ht 61.0 in | Wt 143.6 lb

## 2024-01-27 DIAGNOSIS — R7989 Other specified abnormal findings of blood chemistry: Secondary | ICD-10-CM | POA: Diagnosis not present

## 2024-01-27 DIAGNOSIS — I10 Essential (primary) hypertension: Secondary | ICD-10-CM | POA: Diagnosis not present

## 2024-01-27 MED ORDER — LISINOPRIL 10 MG PO TABS: 10.0000 mg | ORAL_TABLET | Freq: Every day | ORAL | 1 refills | Status: DC

## 2024-01-28 LAB — TSH+FREE T4
Free T4: 1.06 ng/dL (ref 0.82–1.77)
TSH: 3.33 u[IU]/mL (ref 0.450–4.500)

## 2024-01-30 ENCOUNTER — Encounter: Payer: Self-pay | Admitting: Family Medicine

## 2024-01-30 NOTE — Progress Notes (Signed)
 Established Patient Office Visit  Subjective    Patient ID: Tina Byrd, female    DOB: 07-25-1986  Age: 38 y.o. MRN: [id number]  CC:  Chief Complaint  Patient presents with   Follow-up    6 month    HPI Lexianna Aitken presents for follow up of blood pressure. She has been having slightly elevated blood pressures above goal for the last few office visits. She would like to start a low dose med at this time to control her blood pressures.   Outpatient Encounter Medications as of 01/27/2024  Medication Sig   ibuprofen (ADVIL) 600 MG tablet Take 1 tablet (600 mg total) by mouth every 8 (eight) hours as needed.   lisinopril (ZESTRIL) 10 MG tablet Take 1 tablet (10 mg total) by mouth daily.   olopatadine (PATANOL) 0.1 % ophthalmic solution Place 1 drop into both eyes 2 (two) times daily. (Patient not taking: Reported on 01/27/2024)   traZODone (DESYREL) 50 MG tablet Take 1 tablet (50 mg total) by mouth at bedtime. (Patient not taking: Reported on 01/27/2024)   No facility-administered encounter medications on file as of 01/27/2024.    History reviewed. No pertinent past medical history.  History reviewed. No pertinent surgical history.  Family History  Problem Relation Age of Onset   Diabetes Mother    Lung disease Mother        "the blood vessel to lung is too small   Diabetes Father    Hypertension Father     Social History   Socioeconomic History   Marital status: Married    Spouse name: Bubba Camp   Number of children: 1   Years of education: 10   Highest education level: Not on file  Occupational History   Occupation: Housewife  Tobacco Use   Smoking status: Never   Smokeless tobacco: Never  Vaping Use   Vaping status: Never Used  Substance and Sexual Activity   Alcohol use: No    Alcohol/week: 0.0 standard drinks of alcohol   Drug use: No   Sexual activity: Yes    Birth control/protection: None  Other Topics Concern   Not on file  Social History Narrative    Originally from [place]   Was in a camp in [place] for 2 years.   Her husband left 2 sons behind in [place], ages 66 and 40.  He had them with a girlfriend in past.   They plan to bring them to [place] When they have the financial ability to do so.   Also, 2 yo daughter, [name], born in [place].   Lives at home with husband and daughter.   Social Drivers of Corporate investment banker Strain: Low Risk  (01/27/2024)   Overall Financial Resource Strain (CARDIA)    Difficulty of Paying Living Expenses: Not hard at all  Food Insecurity: No Food Insecurity (01/27/2024)   Hunger Vital Sign    Worried About Running Out of Food in the Last Year: Never true    Ran Out of Food in the Last Year: Never true  Transportation Needs: No Transportation Needs (01/27/2024)   PRAPARE - Administrator, Civil Service (Medical): No    Lack of Transportation (Non-Medical): No  Physical Activity: Inactive (01/27/2024)   Exercise Vital Sign    Days of Exercise per Week: 0 days    Minutes of Exercise per Session: 0 min  Stress: No Stress Concern Present (01/27/2024)   [employer] of Occupational Health - Occupational Stress Questionnaire  Feeling of Stress : Only a little  Social Connections: Moderately Integrated (01/27/2024)   Social Connection and Isolation Panel [NHANES]    Frequency of Communication with Friends and Family: More than three times a week    Frequency of Social Gatherings with Friends and Family: More than three times a week    Attends Religious Services: More than 4 times per year    Active Member of [employer] or Organizations: No    Attends Banker Meetings: Never    Marital Status: Married  Catering manager Violence: Not At Risk (01/27/2024)   Humiliation, Afraid, Rape, and Kick questionnaire    Fear of Current or Ex-Partner: No    Emotionally Abused: No    Physically Abused: No    Sexually Abused: No    Review of Systems  All other systems reviewed and are  negative.       Objective    BP (!) 146/97 (BP Location: Right Arm, Patient Position: Sitting, Cuff Size: Normal)   Pulse 68   Temp 98.3 F (36.8 C) (Oral)   Resp 16   Ht 5\' 1"  (1.549 m)   Wt 143 lb 9.6 oz (65.1 kg)   SpO2 99%   BMI 27.13 kg/m   Physical Exam Vitals and nursing note reviewed.  Constitutional:      General: She is not in acute distress. Cardiovascular:     Rate and Rhythm: Normal rate and regular rhythm.  Pulmonary:     Effort: Pulmonary effort is normal.     Breath sounds: Normal breath sounds.  Abdominal:     Palpations: Abdomen is soft.     Tenderness: There is no abdominal tenderness.  Musculoskeletal:     Cervical back: Normal range of motion and neck supple.  Neurological:     General: No focal deficit present.     Mental Status: She is alert and oriented to person, place, and time.         Assessment & Plan:   Essential hypertension  Abnormal TSH -     TSH + free T4  Other orders -     Lisinopril; Take 1 tablet (10 mg total) by mouth daily.  Dispense: 90 tablet; Refill: 1     Return in about 3 months (around 04/25/2024) for follow up.   Tommie Raymond, MD

## 2024-04-27 ENCOUNTER — Ambulatory Visit: Payer: Medicaid Other | Admitting: Family Medicine

## 2024-04-27 VITALS — BP 142/89 | HR 67 | Wt 146.0 lb

## 2024-04-27 DIAGNOSIS — I10 Essential (primary) hypertension: Secondary | ICD-10-CM

## 2024-04-27 MED ORDER — LISINOPRIL 10 MG PO TABS: 10.0000 mg | ORAL_TABLET | Freq: Every day | ORAL | 0 refills | Status: DC

## 2024-04-27 MED ORDER — HYDROCHLOROTHIAZIDE 12.5 MG PO CAPS: 12.5000 mg | ORAL_CAPSULE | Freq: Every day | ORAL | 1 refills | Status: AC

## 2024-04-27 NOTE — Progress Notes (Signed)
 Established Patient Office Visit  Subjective    Patient ID: Tina Byrd, female    DOB: [birth year]  Age: 38 y.o. MRN: [id number]  CC:  Chief Complaint  Patient presents with   Medical Management of Chronic Issues   Dizziness    HPI Dannya Jilek presents for follow up of hypertension. Patient reports med compliance and denies acute complaints.   Outpatient Encounter Medications as of 04/27/2024  Medication Sig   hydrochlorothiazide  (MICROZIDE ) 12.5 MG capsule Take 1 capsule (12.5 mg total) by mouth daily.   [DISCONTINUED] lisinopril  (ZESTRIL ) 10 MG tablet Take 1 tablet (10 mg total) by mouth daily.   ibuprofen  (ADVIL ) 600 MG tablet Take 1 tablet (600 mg total) by mouth every 8 (eight) hours as needed. (Patient not taking: Reported on 04/27/2024)   lisinopril  (ZESTRIL ) 10 MG tablet Take 1 tablet (10 mg total) by mouth daily.   olopatadine  (PATANOL) 0.1 % ophthalmic solution Place 1 drop into both eyes 2 (two) times daily. (Patient not taking: Reported on 04/27/2024)   traZODone  (DESYREL ) 50 MG tablet Take 1 tablet (50 mg total) by mouth at bedtime. (Patient not taking: Reported on 04/27/2024)   No facility-administered encounter medications on file as of 04/27/2024.    No past medical history on file.  No past surgical history on file.  Family History  Problem Relation Age of Onset   Diabetes Mother    Lung disease Mother        "the blood vessel to lung is too small   Diabetes Father    Hypertension Father     Social History   Socioeconomic History   Marital status: Married    Spouse name: Coralee Derby   Number of children: 1   Years of education: 10   Highest education level: Not on file  Occupational History   Occupation: Housewife  Tobacco Use   Smoking status: Never   Smokeless tobacco: Never  Vaping Use   Vaping status: Never Used  Substance and Sexual Activity   Alcohol use: No    Alcohol/week: 0.0 standard drinks of alcohol   Drug use: No   Sexual activity: Yes     Birth control/protection: None  Other Topics Concern   Not on file  Social History Narrative   Originally from [place]   Was in a camp in [place] for 2 years.   Her husband left 2 sons behind in [place], ages 74 and 78.  He had them with a girlfriend in past.   They plan to bring them to [place] When they have the financial ability to do so.   Also, 2 yo daughter, Ivette Marks, born in [place].   Lives at home with husband and daughter.   Social Drivers of Corporate investment banker Strain: Low Risk  (01/27/2024)   Overall Financial Resource Strain (CARDIA)    Difficulty of Paying Living Expenses: Not hard at all  Food Insecurity: No Food Insecurity (01/27/2024)   Hunger Vital Sign    Worried About Running Out of Food in the Last Year: Never true    Ran Out of Food in the Last Year: Never true  Transportation Needs: No Transportation Needs (01/27/2024)   PRAPARE - Administrator, Civil Service (Medical): No    Lack of Transportation (Non-Medical): No  Physical Activity: Inactive (01/27/2024)   Exercise Vital Sign    Days of Exercise per Week: 0 days    Minutes of Exercise per Session: 0 min  Stress: No Stress  Concern Present (01/27/2024)   [employer] of Occupational Health - Occupational Stress Questionnaire    Feeling of Stress : Only a little  Social Connections: Moderately Integrated (01/27/2024)   Social Connection and Isolation Panel [NHANES]    Frequency of Communication with Friends and Family: More than three times a week    Frequency of Social Gatherings with Friends and Family: More than three times a week    Attends Religious Services: More than 4 times per year    Active Member of [employer] or Organizations: No    Attends Banker Meetings: Never    Marital Status: Married  Catering manager Violence: Not At Risk (01/27/2024)   Humiliation, Afraid, Rape, and Kick questionnaire    Fear of Current or Ex-Partner: No    Emotionally Abused: No    Physically  Abused: No    Sexually Abused: No    Review of Systems  All other systems reviewed and are negative.       Objective    BP (!) 142/89 (BP Location: Right Arm, Patient Position: Sitting, Cuff Size: Normal)   Pulse 67   Wt 146 lb (66.2 kg)   SpO2 99%   BMI 27.59 kg/m   Physical Exam Vitals and nursing note reviewed.  Constitutional:      General: She is not in acute distress. Cardiovascular:     Rate and Rhythm: Normal rate and regular rhythm.  Pulmonary:     Effort: Pulmonary effort is normal.     Breath sounds: Normal breath sounds.  Abdominal:     Palpations: Abdomen is soft.     Tenderness: There is no abdominal tenderness.  Musculoskeletal:     Cervical back: Normal range of motion and neck supple.  Neurological:     General: No focal deficit present.     Mental Status: She is alert and oriented to person, place, and time.         Assessment & Plan:   1. Essential hypertension (Primary) Slightly elevated readings. Will add hydrochlorothiazide  12.5 mg to regimen.   Return in about 4 weeks (around 05/25/2024) for follow up.   Arlo Lama, MD

## 2024-05-01 ENCOUNTER — Encounter: Payer: Self-pay | Admitting: Family Medicine

## 2024-05-24 ENCOUNTER — Other Ambulatory Visit (HOSPITAL_COMMUNITY)
Admission: RE | Admit: 2024-05-24 | Discharge: 2024-05-24 | Disposition: A | Discharge status: Home / Self Care | Source: Ambulatory Visit | Attending: Physician Assistant | Admitting: Physician Assistant

## 2024-05-24 ENCOUNTER — Ambulatory Visit: Admitting: Physician Assistant

## 2024-05-24 ENCOUNTER — Encounter: Payer: Self-pay | Admitting: Physician Assistant

## 2024-05-24 VITALS — BP 130/89 | HR 87 | Ht 61.0 in | Wt 146.6 lb

## 2024-05-24 DIAGNOSIS — Z01419 Encounter for gynecological examination (general) (routine) without abnormal findings: Secondary | ICD-10-CM | POA: Diagnosis not present

## 2024-05-24 DIAGNOSIS — N898 Other specified noninflammatory disorders of vagina: Secondary | ICD-10-CM | POA: Diagnosis not present

## 2024-05-24 NOTE — Progress Notes (Signed)
 ANNUAL EXAM Patient name: Tina Byrd MRN [id number]  Date of birth: 1986-09-19 Chief Complaint:   No chief complaint on file.  History of Present Illness:   Tina Byrd is a 38 y.o. G53P2002 female being seen today for a routine annual exam.   Current complaints: Vaginal discharge, white, itchy  No LMP recorded. (Menstrual status: IUD).  The pregnancy intention screening data noted above was reviewed. Potential methods of contraception were discussed. The patient elected to proceed with No data recorded.   Last pap 09/13/22 NILM HPV neg.  H/O abnormal pap: no Last mammogram: Never previously done.  Family h/o breast cancer: yes paternal aunt. Last colonoscopy: Never previously done due to age. Family h/o colorectal cancer: no STI screening: Declined Contraception: Mirena  placed 2023     04/27/2024    8:40 AM 04/26/2023    9:41 AM 10/26/2022    3:10 PM 09/13/2022   10:28 AM  Depression screen PHQ 2/9  Decreased Interest 1 0 0 0  Down, Depressed, Hopeless 0 0 0 0  PHQ - 2 Score 1 0 0 0  Altered sleeping  0 0 0  Tired, decreased energy  1 0 0  Change in appetite  0 0 0  Feeling bad or failure about yourself   0 0 0  Trouble concentrating  0 0 0  Moving slowly or fidgety/restless  1 0 0  Suicidal thoughts  0 0 0  PHQ-9 Score  2 0 0  Difficult doing work/chores  Not difficult at all Not difficult at all         04/27/2024    8:40 AM 01/27/2024    8:32 AM 04/26/2023    9:42 AM 09/13/2022   10:29 AM  GAD 7 : Generalized Anxiety Score  Nervous, Anxious, on Edge 0 1 0 0  Control/stop worrying 0 0 0 0  Worry too much - different things 0 0 0 0  Trouble relaxing 1 0 0 0  Restless 0 0 0 0  Easily annoyed or irritable 0 1 0 0  Afraid - awful might happen 0 0 0 0  Total GAD 7 Score 1 2 0 0  Anxiety Difficulty  Somewhat difficult Not difficult at all      Review of Systems:   Pertinent items are noted in HPI Denies any headaches, blurred vision, fatigue, shortness of  breath, chest pain, abdominal pain, abnormal vaginal discharge/itching/odor/irritation, problems with periods, bowel movements, urination, or intercourse unless otherwise stated above. Pertinent History Reviewed:  Reviewed past medical,surgical, social and family history.  Reviewed problem list, medications and allergies. Physical Assessment:   Vitals:   05/24/24 1310  BP: 130/89  Pulse: 87  Weight: 146 lb 9.6 oz (66.5 kg)  Height: 5' 1 (1.549 m)  Body mass index is 27.7 kg/m.        Physical Examination:   General appearance - well appearing, and in no distress  Mental status - alert, oriented to person, place, and time  Psych:  She has a normal mood and affect  Skin - warm and dry, normal color, no suspicious lesions noted  Chest - effort normal, all lung fields clear to auscultation bilaterally  Heart - normal rate and regular rhythm  Neck:  midline trachea, no thyromegaly or nodules  Breasts - breasts appear normal, no suspicious masses, no skin or nipple changes or  axillary nodes  Abdomen - soft, nontender, nondistended, no masses or organomegaly  Pelvic - VULVA: normal appearing vulva with no  masses, tenderness or lesions  VAGINA: normal appearing vagina with normal color and discharge, no lesions  CERVIX: normal appearing cervix without discharge or lesions, no CMT. +IUD strings visualized at appropriate length  Thin prep pap is done with HR HPV cotesting  UTERUS: uterus is felt to be normal size, shape, consistency and nontender   ADNEXA: No adnexal masses or tenderness noted.  Extremities:  No swelling or varicosities noted  Chaperone present for exam  No results found for this or any previous visit (from the past 24 hours).  Assessment & Plan:   1. Encounter for annual routine gynecological examination (Primary) 2. Vaginal discharge - Cervical cancer screening: Discussed guidelines. Pap with HPV updated today at patient's request.  - STD Testing: Accepts swab only  due to abnormal vaginal d/c - Birth Control: Continue using IUD  - Breast Health: Encouraged self breast awareness/SBE. Teaching provided. Discussed limits of clinical breast exam for detecting breast cancer. Rx given for MXR Colonoscopy: @ 40yo, or sooner if problems - F/U 12 months and prn  - MM 3D SCREENING MAMMOGRAM BILATERAL BREAST; Future - Cervicovaginal ancillary only( [facility]) - Cytology - PAP( [facility])  No orders of the defined types were placed in this encounter.  Meds: No orders of the defined types were placed in this encounter.  Follow-up: No follow-ups on file.  Fadil Macmaster E Treyon Wymore, [place] 05/24/2024 1:31 PM

## 2024-05-24 NOTE — Progress Notes (Signed)
 Complains of white discharge that is sometimes itchy but states this has been occurring approx 6 months to 2 years.   Would like to discuss Mirena  placed 09/13/2022.   Pt request PAP today.

## 2024-05-25 ENCOUNTER — Ambulatory Visit: Admitting: Family Medicine

## 2024-05-25 VITALS — BP 127/87 | HR 62 | Wt 146.0 lb

## 2024-05-25 DIAGNOSIS — I10 Essential (primary) hypertension: Secondary | ICD-10-CM

## 2024-05-25 LAB — CERVICOVAGINAL ANCILLARY ONLY
Bacterial Vaginitis (gardnerella): POSITIVE — AB
Candida Glabrata: NEGATIVE
Candida Vaginitis: NEGATIVE
Chlamydia: NEGATIVE
Comment: NEGATIVE
Comment: NEGATIVE
Comment: NEGATIVE
Comment: NEGATIVE
Comment: NEGATIVE
Comment: NORMAL
Neisseria Gonorrhea: NEGATIVE
Trichomonas: NEGATIVE

## 2024-05-25 MED ORDER — LISINOPRIL-HYDROCHLOROTHIAZIDE 10-12.5 MG PO TABS: 1.0000 | ORAL_TABLET | Freq: Every day | ORAL | 1 refills | Status: AC

## 2024-05-28 ENCOUNTER — Encounter: Payer: Self-pay | Admitting: Family Medicine

## 2024-05-28 ENCOUNTER — Ambulatory Visit (HOSPITAL_COMMUNITY): Payer: Self-pay | Admitting: Physician Assistant

## 2024-05-28 ENCOUNTER — Other Ambulatory Visit: Payer: Self-pay

## 2024-05-28 LAB — CYTOLOGY - PAP
Comment: NEGATIVE
Diagnosis: NEGATIVE
High risk HPV: NEGATIVE

## 2024-05-28 MED ORDER — METRONIDAZOLE 500 MG PO TABS: 500.0000 mg | ORAL_TABLET | Freq: Two times a day (BID) | ORAL | 0 refills | Status: AC

## 2024-05-28 NOTE — Progress Notes (Signed)
 Established Patient Office Visit  Subjective    Patient ID: Tina Byrd, female    DOB: 1986-07-06  Age: 38 y.o. MRN: [id number]  CC:  Chief Complaint  Patient presents with   Medical Management of Chronic Issues    HPI Tina Byrd presents for follow up of hypertension. Patient reports med compliance and denies acute complaints.   Outpatient Encounter Medications as of 05/25/2024  Medication Sig   hydrochlorothiazide  (MICROZIDE ) 12.5 MG capsule Take 1 capsule (12.5 mg total) by mouth daily.   lisinopril  (ZESTRIL ) 10 MG tablet Take 1 tablet (10 mg total) by mouth daily.   lisinopril -hydrochlorothiazide  (ZESTORETIC) 10-12.5 MG tablet Take 1 tablet by mouth daily.   ibuprofen  (ADVIL ) 600 MG tablet Take 1 tablet (600 mg total) by mouth every 8 (eight) hours as needed. (Patient not taking: Reported on 05/25/2024)   olopatadine  (PATANOL) 0.1 % ophthalmic solution Place 1 drop into both eyes 2 (two) times daily. (Patient not taking: Reported on 05/25/2024)   traZODone  (DESYREL ) 50 MG tablet Take 1 tablet (50 mg total) by mouth at bedtime. (Patient not taking: Reported on 05/25/2024)   No facility-administered encounter medications on file as of 05/25/2024.    No past medical history on file.  No past surgical history on file.  Family History  Problem Relation Age of Onset   Diabetes Mother    Lung disease Mother        the blood vessel to lung is too small   Diabetes Father    Hypertension Father     Social History   Socioeconomic History   Marital status: Married    Spouse name: Coralee Derby   Number of children: 1   Years of education: 10   Highest education level: Not on file  Occupational History   Occupation: Housewife  Tobacco Use   Smoking status: Never   Smokeless tobacco: Never  Vaping Use   Vaping status: Never Used  Substance and Sexual Activity   Alcohol use: No    Alcohol/week: 0.0 standard drinks of alcohol   Drug use: No   Sexual activity: Yes    Birth  control/protection: None  Other Topics Concern   Not on file  Social History Narrative   Originally from [place]   Was in a camp in [place] for 2 years.   Her husband left 2 sons behind in [place], ages 42 and 58.  He had them with a girlfriend in past.   They plan to bring them to [place] When they have the financial ability to do so.   Also, 2 yo daughter, Ivette Marks, born in [place].   Lives at home with husband and daughter.   Social Drivers of Corporate investment banker Strain: Low Risk  (01/27/2024)   Overall Financial Resource Strain (CARDIA)    Difficulty of Paying Living Expenses: Not hard at all  Food Insecurity: No Food Insecurity (01/27/2024)   Hunger Vital Sign    Worried About Running Out of Food in the Last Year: Never true    Ran Out of Food in the Last Year: Never true  Transportation Needs: No Transportation Needs (01/27/2024)   PRAPARE - Administrator, Civil Service (Medical): No    Lack of Transportation (Non-Medical): No  Physical Activity: Inactive (01/27/2024)   Exercise Vital Sign    Days of Exercise per Week: 0 days    Minutes of Exercise per Session: 0 min  Stress: No Stress Concern Present (01/27/2024)   [employer]  of Occupational Health - Occupational Stress Questionnaire    Feeling of Stress : Only a little  Social Connections: Moderately Integrated (01/27/2024)   Social Connection and Isolation Panel    Frequency of Communication with Friends and Family: More than three times a week    Frequency of Social Gatherings with Friends and Family: More than three times a week    Attends Religious Services: More than 4 times per year    Active Member of [employer] or Organizations: No    Attends Banker Meetings: Never    Marital Status: Married  Catering manager Violence: Not At Risk (01/27/2024)   Humiliation, Afraid, Rape, and Kick questionnaire    Fear of Current or Ex-Partner: No    Emotionally Abused: No    Physically Abused: No     Sexually Abused: No    Review of Systems  All other systems reviewed and are negative.       Objective    BP 127/87 (BP Location: Right Arm, Patient Position: Sitting, Cuff Size: Normal)   Pulse 62   Wt 146 lb 0.6 oz (66.2 kg)   SpO2 98%   BMI 27.59 kg/m   Physical Exam Vitals and nursing note reviewed.  Constitutional:      General: She is not in acute distress.  Cardiovascular:     Rate and Rhythm: Normal rate and regular rhythm.  Pulmonary:     Effort: Pulmonary effort is normal.     Breath sounds: Normal breath sounds.  Abdominal:     Palpations: Abdomen is soft.     Tenderness: There is no abdominal tenderness.   Musculoskeletal:     Cervical back: Normal range of motion and neck supple.   Neurological:     General: No focal deficit present.     Mental Status: She is alert and oriented to person, place, and time.         Assessment & Plan:   Essential hypertension  Other orders -     Lisinopril -hydroCHLOROthiazide ; Take 1 tablet by mouth daily.  Dispense: 90 tablet; Refill: 1   Patient doing well with present management. Meds refilled  - continue  Return in about 6 months (around 11/24/2024) for follow up.   Arlo Lama, MD

## 2024-06-21 ENCOUNTER — Ambulatory Visit
Admission: RE | Admit: 2024-06-21 | Discharge: 2024-06-21 | Disposition: A | Discharge status: Home / Self Care | Source: Ambulatory Visit | Attending: Physician Assistant | Admitting: Physician Assistant

## 2024-06-21 DIAGNOSIS — Z01419 Encounter for gynecological examination (general) (routine) without abnormal findings: Secondary | ICD-10-CM

## 2024-08-04 ENCOUNTER — Other Ambulatory Visit: Payer: Self-pay | Admitting: Family Medicine

## 2024-11-26 ENCOUNTER — Ambulatory Visit: Admitting: Family Medicine

## 2024-11-26 ENCOUNTER — Encounter: Payer: Self-pay | Admitting: Family Medicine

## 2024-11-26 VITALS — BP 139/95 | HR 58 | Ht 61.0 in | Wt 145.0 lb

## 2024-11-26 DIAGNOSIS — E065 Other chronic thyroiditis: Secondary | ICD-10-CM

## 2024-11-26 DIAGNOSIS — I10 Essential (primary) hypertension: Secondary | ICD-10-CM

## 2024-11-26 DIAGNOSIS — J309 Allergic rhinitis, unspecified: Secondary | ICD-10-CM | POA: Diagnosis not present

## 2024-11-26 MED ORDER — FLUTICASONE PROPIONATE 50 MCG/ACT NA SUSP: 2.0000 | Freq: Every day | NASAL | 6 refills | Status: AC

## 2024-11-26 MED ORDER — MECLIZINE HCL 12.5 MG PO TABS: 12.5000 mg | ORAL_TABLET | Freq: Three times a day (TID) | ORAL | 0 refills | Status: AC | PRN

## 2024-11-26 NOTE — Progress Notes (Unsigned)
 Established Patient Office Visit  Subjective    Patient ID: Tina Byrd, female    DOB: 07/15/1986  Age: 38 y.o. MRN: [id number]  CC:  Chief Complaint  Patient presents with   Medical Management of Chronic Issues    Pt reports feeling dizzy and nauseas lately     HPI Tina Byrd presents for routine follow up of  hypertension. She reports med compliance. Patient also reports some nasal congestion with intermittent dizziness. Patient denies fever/chills or viral sx.   Outpatient Encounter Medications as of 11/26/2024  Medication Sig   fluticasone  (FLONASE ) 50 MCG/ACT nasal spray Place 2 sprays into both nostrils daily.   lisinopril  (ZESTRIL ) 10 MG tablet TAKE 1 TABLET(10 MG) BY MOUTH DAILY   meclizine  (ANTIVERT ) 12.5 MG tablet Take 1 tablet (12.5 mg total) by mouth 3 (three) times daily as needed for dizziness.   hydrochlorothiazide  (MICROZIDE ) 12.5 MG capsule Take 1 capsule (12.5 mg total) by mouth daily. (Patient not taking: Reported on 11/26/2024)   ibuprofen  (ADVIL ) 600 MG tablet Take 1 tablet (600 mg total) by mouth every 8 (eight) hours as needed. (Patient not taking: Reported on 05/25/2024)   lisinopril -hydrochlorothiazide  (ZESTORETIC ) 10-12.5 MG tablet Take 1 tablet by mouth daily. (Patient not taking: Reported on 11/26/2024)   metroNIDAZOLE  (FLAGYL ) 500 MG tablet Take 1 tablet (500 mg total) by mouth 2 (two) times daily.   olopatadine  (PATANOL) 0.1 % ophthalmic solution Place 1 drop into both eyes 2 (two) times daily. (Patient not taking: Reported on 05/25/2024)   traZODone  (DESYREL ) 50 MG tablet Take 1 tablet (50 mg total) by mouth at bedtime. (Patient not taking: Reported on 05/25/2024)   No facility-administered encounter medications on file as of 11/26/2024.    History reviewed. No pertinent past medical history.  History reviewed. No pertinent surgical history.  Family History  Problem Relation Age of Onset   Diabetes Mother    Lung disease Mother        the blood  vessel to lung is too small   Diabetes Father    Hypertension Father    Breast cancer Paternal Aunt 8    Social History   Socioeconomic History   Marital status: Married    Spouse name: Christella   Number of children: 1   Years of education: 10   Highest education level: Not on file  Occupational History   Occupation: Housewife  Tobacco Use   Smoking status: Never   Smokeless tobacco: Never  Vaping Use   Vaping status: Never Used  Substance and Sexual Activity   Alcohol use: No    Alcohol/week: 0.0 standard drinks of alcohol   Drug use: No   Sexual activity: Yes    Birth control/protection: None  Other Topics Concern   Not on file  Social History Narrative   Originally from [place]   Was in a camp in [place] for 2 years.   Her husband left 2 sons behind in [place], ages 13 and 41.  He had them with a girlfriend in past.   They plan to bring them to [place] When they have the financial ability to do so.   Also, 2 yo daughter, Asberry, born in [place].   Lives at home with husband and daughter.   Social Drivers of Health   Tobacco Use: Low Risk (11/26/2024)   Patient History    Smoking Tobacco Use: Never    Smokeless Tobacco Use: Never    Passive Exposure: Not on file  Financial Resource Strain: Low  Risk (01/27/2024)   Overall Financial Resource Strain (CARDIA)    Difficulty of Paying Living Expenses: Not hard at all  Food Insecurity: No Food Insecurity (01/27/2024)   Hunger Vital Sign    Worried About Running Out of Food in the Last Year: Never true    Ran Out of Food in the Last Year: Never true  Transportation Needs: No Transportation Needs (01/27/2024)   PRAPARE - Administrator, Civil Service (Medical): No    Lack of Transportation (Non-Medical): No  Physical Activity: Inactive (01/27/2024)   Exercise Vital Sign    Days of Exercise per Week: 0 days    Minutes of Exercise per Session: 0 min  Stress: No Stress Concern Present (01/27/2024)   [employer] of  Occupational Health - Occupational Stress Questionnaire    Feeling of Stress : Only a little  Social Connections: Moderately Integrated (01/27/2024)   Social Connection and Isolation Panel    Frequency of Communication with Friends and Family: More than three times a week    Frequency of Social Gatherings with Friends and Family: More than three times a week    Attends Religious Services: More than 4 times per year    Active Member of [employer] or Organizations: No    Attends Banker Meetings: Never    Marital Status: Married  Catering Manager Violence: Not At Risk (01/27/2024)   Humiliation, Afraid, Rape, and Kick questionnaire    Fear of Current or Ex-Partner: No    Emotionally Abused: No    Physically Abused: No    Sexually Abused: No  Depression (PHQ2-9): Low Risk (05/25/2024)   Depression (PHQ2-9)    PHQ-2 Score: 1  Alcohol Screen: Low Risk (01/27/2024)   Alcohol Screen    Last Alcohol Screening Score (AUDIT): 1  Housing: Unknown (01/27/2024)   Housing Stability Vital Sign    Unable to Pay for Housing in the Last Year: No    Number of Times Moved in the Last Year: Not on file    Homeless in the Last Year: No  Utilities: Not At Risk (01/27/2024)   [facility] Utilities    Threatened with loss of utilities: No  Health Literacy: Adequate Health Literacy (01/27/2024)   B1300 Health Literacy    Frequency of need for help with medical instructions: Never    Review of Systems  HENT:  Positive for congestion.   Neurological:  Positive for dizziness.  All other systems reviewed and are negative.       Objective    BP (!) 139/95   Pulse (!) 58   Ht 5' 1 (1.549 m)   Wt 145 lb (65.8 kg)   SpO2 99%   BMI 27.40 kg/m   Physical Exam Vitals and nursing note reviewed.  Constitutional:      General: She is not in acute distress. HENT:     Nose: Congestion present.     Mouth/Throat:     Mouth: Mucous membranes are moist.     Pharynx: Oropharynx is clear.  Cardiovascular:      Rate and Rhythm: Normal rate and regular rhythm.  Pulmonary:     Effort: Pulmonary effort is normal.     Breath sounds: Normal breath sounds.  Abdominal:     Palpations: Abdomen is soft.     Tenderness: There is no abdominal tenderness.  Neurological:     General: No focal deficit present.     Mental Status: She is alert and oriented to person, place, and  time.         Assessment & Plan:   Essential hypertension  Chronic thyroiditis -     TSH + free T4  Allergic rhinitis, unspecified seasonality, unspecified trigger  Other orders -     Fluticasone  Propionate; Place 2 sprays into both nostrils daily.  Dispense: 16 g; Refill: 6 -     Meclizine  HCl; Take 1 tablet (12.5 mg total) by mouth 3 (three) times daily as needed for dizziness.  Dispense: 30 tablet; Refill: 0     Return in about 6 months (around 05/27/2025) for follow up.   Tanda Raguel SQUIBB, MD

## 2024-11-27 ENCOUNTER — Ambulatory Visit: Payer: Self-pay | Admitting: Family Medicine

## 2024-11-27 LAB — TSH+FREE T4
Free T4: 1.13 ng/dL (ref 0.82–1.77)
TSH: 4.38 u[IU]/mL (ref 0.450–4.500)

## 2024-11-28 ENCOUNTER — Encounter: Payer: Self-pay | Admitting: Family Medicine

## 2025-05-27 ENCOUNTER — Ambulatory Visit: Payer: Self-pay | Admitting: Family Medicine
